# Patient Record
Sex: Male | Born: 1955
Health system: Southern US, Community
[De-identification: ages and names within clinical notes are randomized; demographics above are authoritative.]

## PROBLEM LIST (undated history)

## (undated) DIAGNOSIS — C61 Malignant neoplasm of prostate: Secondary | ICD-10-CM

## (undated) DIAGNOSIS — R972 Elevated prostate specific antigen [PSA]: Secondary | ICD-10-CM

## (undated) DIAGNOSIS — Z808 Family history of malignant neoplasm of other organs or systems: Secondary | ICD-10-CM

## (undated) DIAGNOSIS — K219 Gastro-esophageal reflux disease without esophagitis: Secondary | ICD-10-CM

## (undated) DIAGNOSIS — Z803 Family history of malignant neoplasm of breast: Secondary | ICD-10-CM

## (undated) DIAGNOSIS — Z8042 Family history of malignant neoplasm of prostate: Secondary | ICD-10-CM

## (undated) DIAGNOSIS — H409 Unspecified glaucoma: Secondary | ICD-10-CM

## (undated) HISTORY — DX: Malignant neoplasm of prostate: C61

## (undated) HISTORY — DX: Family history of malignant neoplasm of other organs or systems: Z80.8

## (undated) HISTORY — DX: Unspecified glaucoma: H40.9

## (undated) HISTORY — DX: Family history of malignant neoplasm of breast: Z80.3

## (undated) HISTORY — PX: HERNIA REPAIR: SHX51

## (undated) HISTORY — DX: Gastro-esophageal reflux disease without esophagitis: K21.9

## (undated) HISTORY — DX: Family history of malignant neoplasm of prostate: Z80.42

---

## 2013-12-30 HISTORY — PX: PROSTATECTOMY: SHX69

## 2014-08-04 DIAGNOSIS — L570 Actinic keratosis: Secondary | ICD-10-CM | POA: Insufficient documentation

## 2014-08-04 DIAGNOSIS — H409 Unspecified glaucoma: Secondary | ICD-10-CM | POA: Insufficient documentation

## 2014-08-04 DIAGNOSIS — R972 Elevated prostate specific antigen [PSA]: Secondary | ICD-10-CM | POA: Insufficient documentation

## 2014-08-04 DIAGNOSIS — R0789 Other chest pain: Secondary | ICD-10-CM | POA: Insufficient documentation

## 2014-08-04 DIAGNOSIS — K219 Gastro-esophageal reflux disease without esophagitis: Secondary | ICD-10-CM | POA: Insufficient documentation

## 2014-10-20 DIAGNOSIS — N402 Nodular prostate without lower urinary tract symptoms: Secondary | ICD-10-CM | POA: Insufficient documentation

## 2019-02-25 DIAGNOSIS — C61 Malignant neoplasm of prostate: Secondary | ICD-10-CM | POA: Diagnosis not present

## 2019-03-04 DIAGNOSIS — C61 Malignant neoplasm of prostate: Secondary | ICD-10-CM | POA: Diagnosis not present

## 2019-06-08 DIAGNOSIS — H401313 Pigmentary glaucoma, right eye, severe stage: Secondary | ICD-10-CM | POA: Diagnosis not present

## 2019-06-08 DIAGNOSIS — H2513 Age-related nuclear cataract, bilateral: Secondary | ICD-10-CM | POA: Diagnosis not present

## 2019-06-08 DIAGNOSIS — H401321 Pigmentary glaucoma, left eye, mild stage: Secondary | ICD-10-CM | POA: Diagnosis not present

## 2019-06-08 DIAGNOSIS — H43813 Vitreous degeneration, bilateral: Secondary | ICD-10-CM | POA: Diagnosis not present

## 2019-11-03 DIAGNOSIS — Z1211 Encounter for screening for malignant neoplasm of colon: Secondary | ICD-10-CM | POA: Diagnosis not present

## 2019-11-03 DIAGNOSIS — Z0000 Encounter for general adult medical examination without abnormal findings: Secondary | ICD-10-CM | POA: Diagnosis not present

## 2019-12-01 DIAGNOSIS — Z1211 Encounter for screening for malignant neoplasm of colon: Secondary | ICD-10-CM | POA: Diagnosis not present

## 2019-12-01 DIAGNOSIS — Z01818 Encounter for other preprocedural examination: Secondary | ICD-10-CM | POA: Diagnosis not present

## 2019-12-07 DIAGNOSIS — H401313 Pigmentary glaucoma, right eye, severe stage: Secondary | ICD-10-CM | POA: Diagnosis not present

## 2019-12-07 DIAGNOSIS — H401321 Pigmentary glaucoma, left eye, mild stage: Secondary | ICD-10-CM | POA: Diagnosis not present

## 2019-12-17 DIAGNOSIS — Z1159 Encounter for screening for other viral diseases: Secondary | ICD-10-CM | POA: Diagnosis not present

## 2019-12-22 DIAGNOSIS — Z1211 Encounter for screening for malignant neoplasm of colon: Secondary | ICD-10-CM | POA: Diagnosis not present

## 2019-12-22 DIAGNOSIS — K5730 Diverticulosis of large intestine without perforation or abscess without bleeding: Secondary | ICD-10-CM | POA: Diagnosis not present

## 2019-12-22 DIAGNOSIS — D122 Benign neoplasm of ascending colon: Secondary | ICD-10-CM | POA: Diagnosis not present

## 2019-12-22 DIAGNOSIS — K640 First degree hemorrhoids: Secondary | ICD-10-CM | POA: Diagnosis not present

## 2020-05-01 DIAGNOSIS — C61 Malignant neoplasm of prostate: Secondary | ICD-10-CM | POA: Diagnosis not present

## 2020-05-05 DIAGNOSIS — C61 Malignant neoplasm of prostate: Secondary | ICD-10-CM | POA: Diagnosis not present

## 2020-05-05 DIAGNOSIS — N400 Enlarged prostate without lower urinary tract symptoms: Secondary | ICD-10-CM | POA: Diagnosis not present

## 2020-05-19 DIAGNOSIS — N400 Enlarged prostate without lower urinary tract symptoms: Secondary | ICD-10-CM | POA: Diagnosis not present

## 2020-07-12 DIAGNOSIS — H401321 Pigmentary glaucoma, left eye, mild stage: Secondary | ICD-10-CM | POA: Diagnosis not present

## 2020-07-12 DIAGNOSIS — H524 Presbyopia: Secondary | ICD-10-CM | POA: Diagnosis not present

## 2020-07-12 DIAGNOSIS — H2513 Age-related nuclear cataract, bilateral: Secondary | ICD-10-CM | POA: Diagnosis not present

## 2020-07-12 DIAGNOSIS — H401313 Pigmentary glaucoma, right eye, severe stage: Secondary | ICD-10-CM | POA: Diagnosis not present

## 2020-11-21 ENCOUNTER — Ambulatory Visit: Payer: BC Managed Care – PPO | Admitting: Sports Medicine

## 2020-11-21 ENCOUNTER — Ambulatory Visit
Admission: RE | Admit: 2020-11-21 | Discharge: 2020-11-21 | Disposition: A | Discharge status: Home / Self Care | Payer: BC Managed Care – PPO | Source: Ambulatory Visit | Attending: Sports Medicine | Admitting: Sports Medicine

## 2020-11-21 VITALS — BP 129/84 | Ht 70.0 in | Wt 161.0 lb

## 2020-11-21 DIAGNOSIS — M25561 Pain in right knee: Secondary | ICD-10-CM | POA: Diagnosis not present

## 2020-11-21 NOTE — Progress Notes (Signed)
PCP: No primary care provider on file.  Subjective:   HPI: Patient is a 64 y.o. male runner here for evaluation of right knee pain.  Patient states that he started having knee pain and the sensation of his knee giving out on him starting in July 2021.  He reports that a few days prior to the first time his knee started giving out, he was at a wedding and was doing some dancing and jumping though he does not recall any specific injury.  He denies any point have any knee swelling.  He tried to manage conservatively by reducing his mileage and trying to do some quad and hamstring strengthening on his own.  He did not notice much benefit from this.  His symptoms progressed from giving out to more significant pain in his knee.  Pain feels deep in his knee joint and he cannot locate side.  It is worse with deep flexion like when he is doing deep squats or stretching his quads.  He has not noticed any numbness or tingling, he has some occasional right-sided back pain but nothing significant.  He did not notice significant weakness aside from the times when he gives out. Thus far, he has tried new shoes, rest, home exercise program, and avoiding aggravating activities but it has continued to be a problem for him.   Review of Systems:  Per HPI.   [place], medications and smoking status reviewed.      Objective:  Physical Exam:  [place] Adult Exercise 11/21/2020  Frequency of aerobic exercise (# of days/week) 5  Average time in minutes 60  Frequency of strengthening activities (# of days/week) 3     Gen: awake, alert, NAD, comfortable in exam room Pulm: breathing unlabored  Right knee  Inspection: Bilateral knees without evidence of erythema, ecchymosis, swelling, edema. No effusion present.  There is a 1 cm leg length discrepancy (left leg longer than right leg). ROM: Intact. 0-160d, with pain at end flexion both passively and actively.  Strength: 5/5 strength to resisted  flexion/extension without pain  Patella: Negative patellar grind. No patellar facet tenderness. No apprehension. No proximal or distal patellar tendon tenderness to palpation. No quad tendon tenderness to palpation.  Tibia: No tibial plateau, tibial tuberosity tenderness.  Joint line: Positive medial/posterior medial joint line tenderness Popliteal: No popliteal tenderness to the insertional gastroc. No insertional biceps femoris, semimembranosis, semitendinosis tenderness.  McMurrays/Thessaly's: Negative bilaterally for pain, catching  Lachmans: Stable bilaterally with firm endpoint  Anterior/Posterior drawer: Stable bilaterally Varus/valgus stress at 0, 15d: Negative for pain, laxity  Gait: With standing, patient has collapse of the longitudinal arch bilaterally, more significant on the left with slight eversion of the left foot.  While running, patient has significant overpronation bilaterally, more significant on the left with genu valgus of the left knee.   Assessment & Plan:  1.  Right knee pain  Differential for patient's knee pain includes osteoarthritis, meniscus tear.  His medial pain on palpation, along with the giving out and pain with deep flexion is suspicious for medial meniscus degenerative tear.  His gait does show significant overpronation and he has a slight leg length discrepancy.  Suspect this may be contributing to symptoms.  We discussed consideration of orthotics and he would like to pursue this.  Given that he is not having mechanical symptoms we will hold off on MRI.  Plan: -X-ray right knee to investigate for OA -Home exercise program: Hip abductor strengthening and isometric quad exercises -  Body helix knee compression sleeve -Return next Tuesday for orthotics -OTC Tylenol/ibuprofen as needed  Dagoberto Ligas, MD [facility] Fellow 11/21/2020 10:13 AM   Patient seen and evaluated with the sports medicine fellow.  I agree with the above plan of care.   Patient will get an x-ray of his right knee and will follow up with [place] in 1 week for custom orthotics.  We will also fit his right knee with a body helix compression sleeve and he will start a home exercise program concentrating on hip abductor strengthening and quad strengthening.

## 2020-11-21 NOTE — Patient Instructions (Signed)
Thank you for coming in to see [place] today!  Your right knee pain is likely due to a degenerative meniscus tear or osteoarthritis.  Please see below to review our plan for today's visit:   1.   Please start doing the physical therapy exercises that were shown to you today. 2.   Please go to [facility] imaging for x-rays of your right knee. 3.   Try using a compression sleeve when you are doing aggravating activities. 4.  Please follow-up in 1 week for orthotics, please bring your shoes. 5.  You can use Tylenol or ibuprofen as needed for pain.   Please call the clinic at [phone] if your symptoms worsen or you have any concerns. It was our pleasure to serve you.       Dr. Dagoberto Ligas [facility]

## 2020-11-28 ENCOUNTER — Ambulatory Visit (INDEPENDENT_AMBULATORY_CARE_PROVIDER_SITE_OTHER): Payer: BC Managed Care – PPO | Admitting: Sports Medicine

## 2020-11-28 ENCOUNTER — Other Ambulatory Visit: Payer: Self-pay

## 2020-11-28 VITALS — BP 118/84 | Ht 70.0 in | Wt 162.0 lb

## 2020-11-28 DIAGNOSIS — M2141 Flat foot [pes planus] (acquired), right foot: Secondary | ICD-10-CM

## 2020-11-28 DIAGNOSIS — M25561 Pain in right knee: Secondary | ICD-10-CM | POA: Diagnosis not present

## 2020-11-28 DIAGNOSIS — M2142 Flat foot [pes planus] (acquired), left foot: Secondary | ICD-10-CM

## 2020-11-28 NOTE — Progress Notes (Signed)
Tim presents for fitting of a custom orthotic.  He was just recently seen here with right-sided knee pain, x-rays were fairly unremarkable and we suspect degenerative meniscus tear as the cause of his pain.  He has been doing his exercises as recommended and feels like he started have some benefit with this.  Patient was fitted for a : standard, cushioned, semi-rigid orthotic. The orthotic was heated and afterward the patient stood on the orthotic blank positioned on the orthotic stand. The patient was positioned in subtalar neutral position and 10 degrees of ankle dorsiflexion in a weight bearing stance. After completion of molding, a stable base was applied to the orthotic blank. The blank was ground to a stable position for weight bearing. Size: 10 Base: Blue EVA Posting: None Additional orthotic padding: Patient does have a small approximately 1 cm leg length discrepancy (left longer than right), so a higher amount of blue EVA base on the left side was shaved off during the fashioning process.  Patient reported that the orthotics felt comfortable with testing in the hallway.  He was also noted to have significant improvement in his overpronation to near neutral.  We will plan to follow-up in approximately 1 month to check back in.  Continue current interventions for now.  Dagoberto Ligas, MD Sports Medicine Fellow, [place]  Patient seen and evaluated with the sports medicine fellow.  I agree with the above plan of care.  X-rays show a paucity of degenerative changes.  Custom orthotics were created as above.  He will continue with his home exercises and a gradual return to running.  Follow-up with me again in 4 weeks.  If symptoms persist, we may need to consider merits of further diagnostic imaging.

## 2021-01-04 ENCOUNTER — Ambulatory Visit: Payer: BC Managed Care – PPO | Admitting: Sports Medicine

## 2021-01-04 ENCOUNTER — Other Ambulatory Visit: Payer: Self-pay

## 2021-01-04 VITALS — BP 112/78 | Ht 70.0 in | Wt 161.0 lb

## 2021-01-04 DIAGNOSIS — M25561 Pain in right knee: Secondary | ICD-10-CM

## 2021-01-04 NOTE — Progress Notes (Signed)
Patient ID: Stedman [place], male   DOB: [birth year], 65 y.o.   MRN: [id number]  Rosanne Ashing comes in today for follow-up on right knee pain.  His pain is improving.  He has been able to return to running and is happy with his progress.  He continues to wear his compression sleeve.  He has been diligent about doing home exercises to strengthen his quads and hips.  He finds his orthotics to be comfortable as well.  The feelings of instability that he was experiencing previously have not completely resolved but are a lot less frequent.  Physical exam was not repeated today.  I am pleased with his progress to date.  I did provide him with the contact information for [facility] if he would like more advanced exercises for his quad, hamstrings, and hip.  He understands the 10% role when it comes to running injuries and return to running.  At this point in time I do not feel like any further work-up or treatment is necessary.  X-rays of his knee done previously showed no significant degenerative changes.  If his symptoms return then we may need to consider merits of further diagnostic imaging.  Follow-up for ongoing or recalcitrant issues.

## 2021-01-05 ENCOUNTER — Encounter: Payer: Self-pay | Admitting: Internal Medicine

## 2021-01-05 ENCOUNTER — Other Ambulatory Visit: Payer: Self-pay | Admitting: Internal Medicine

## 2021-01-05 DIAGNOSIS — M546 Pain in thoracic spine: Secondary | ICD-10-CM | POA: Diagnosis not present

## 2021-01-05 DIAGNOSIS — M25561 Pain in right knee: Secondary | ICD-10-CM | POA: Diagnosis not present

## 2021-01-08 DIAGNOSIS — C61 Malignant neoplasm of prostate: Secondary | ICD-10-CM | POA: Diagnosis not present

## 2021-01-08 DIAGNOSIS — N400 Enlarged prostate without lower urinary tract symptoms: Secondary | ICD-10-CM | POA: Diagnosis not present

## 2021-01-22 DIAGNOSIS — R972 Elevated prostate specific antigen [PSA]: Secondary | ICD-10-CM | POA: Diagnosis not present

## 2021-01-22 DIAGNOSIS — C61 Malignant neoplasm of prostate: Secondary | ICD-10-CM | POA: Diagnosis not present

## 2021-02-02 ENCOUNTER — Other Ambulatory Visit: Payer: Self-pay

## 2021-02-02 ENCOUNTER — Ambulatory Visit: Payer: BC Managed Care – PPO | Admitting: Family Medicine

## 2021-02-02 DIAGNOSIS — M545 Low back pain, unspecified: Secondary | ICD-10-CM | POA: Diagnosis not present

## 2021-02-02 MED ORDER — MELOXICAM 15 MG PO TABS: ORAL_TABLET | ORAL | 0 refills | Status: DC

## 2021-02-02 MED ORDER — CYCLOBENZAPRINE HCL 5 MG PO TABS: ORAL_TABLET | ORAL | 0 refills | Status: DC

## 2021-02-02 NOTE — Progress Notes (Addendum)
    SUBJECTIVE:   CHIEF COMPLAINT / HPI:   Acute low back pain Mr. Ian White is a very pleasant 65 year old male who is in a known patient at this practice presenting today with 6 days of acute low back pain.  It began while he was using a shovel to break up some ice around his house and as he was going down he went pick up a piece of it with a shovel and as he bent over and went to lift up he felt a pop in his low back.  He had some pain at that time almost immediately however it did not feel as bad he felt like he can keep going so he continued to work.  Slowly during the day it got worse until the point where he came that he needed to lay down.  He noticed after he lay back down and try to get up he was very stiff and it was difficult for him to move and he felt as if he was leaning and favoring one side.  He denies any instability or falls or changes to his balance.  He is having continual pain in his low back on both sides that is radiating around the lower back musculature.  He has no radiating symptoms down either leg.  He denies any numbness tingling or burning sensations.  He denies any saddle anesthesia he denies any bowel or bladder incontinence.  PERTINENT  PMH / PSH: GERD, elevated PSA, glaucoma  OBJECTIVE:   BP 124/82   Ht 5\' 10"  (1.778 m)   Wt 160 lb (72.6 kg)   BMI 22.96 kg/m   [facility] 11/21/2020 11/28/2020  Frequency of aerobic exercise (# of days/week) 5 5  Average time in minutes 60 60  Frequency of strengthening activities (# of days/week) 3 3    Lumbar spine:  - Inspection: no gross deformity or asymmetry, mild swelling at the right lower lumbar musculature, no ecchymosis. No skin changes - Palpation: No TTP over the spinous processes, TTP at paraspinal muscles bilaterally, no SI joint pain b/l - ROM: Very limited active ROM of the lumbar spine in flexion and extension, more pain is elicited with flexion and extension - Strength: 5/5  strength of lower extremity in L4-S1 nerve root distributions b/l - Neuro: sensation intact in the L4-S1 nerve root distribution b/l, 2+ L4 and S1 reflexes Special Tests:  - Slump test: NEG   ASSESSMENT/PLAN:   Low back pain Given history, presentation, and physical exam he most likely has muscle strain and spasm of the low back.  Most likely due to overexertion.  Recommend conservative treatment at this time and prescribed meloxicam and a muscle relaxer.  He had used salonpas patches with some relief and I recommended he could continue doing that as well as applying heating pad several times per day.  Also gave him some light stretches to do once he begins to feel some relief.  Follow-up in 4 weeks to ensure he is improving.  He will contact me if he does not improve with conservative management we can consider imaging.     Nuala Alpha, DO PGY-4, Sports Medicine Fellow [place]  I was the preceptor for this visit and available for immediate consultation Shellia Cleverly, DO

## 2021-02-02 NOTE — Assessment & Plan Note (Signed)
Given history, presentation, and physical exam he most likely has muscle strain and spasm of the low back.  Most likely due to overexertion.  Recommend conservative treatment at this time and prescribed meloxicam and a muscle relaxer.  He had used salonpas patches with some relief and I recommended he could continue doing that as well as applying heating pad several times per day.  Also gave him some light stretches to do once he begins to feel some relief.  Follow-up in 4 weeks to ensure he is improving.  He will contact me if he does not improve with conservative management we can consider imaging.

## 2021-02-02 NOTE — Patient Instructions (Signed)
It was great to meet you today! Thank you for letting me participate in your care!  Today, we discussed your low back pain which is from an acute muscle strain of the lumbar spinal muscles. I prescribed Meloxicam and Flexeril so please take them as directed. In terms of other things to do try for pain relief use a heating pad, salonpas patches, Blue Emu, or Voltaren Gel. You can also try capsaicin cream. Do the stretches at least once a day as long as they don't cause extreme pain.  If you have no improvement in 2 weeks please return to see me.  Be well, Harolyn Rutherford, DO PGY-4, Sports Medicine Fellow [place]

## 2021-03-05 DIAGNOSIS — Z0000 Encounter for general adult medical examination without abnormal findings: Secondary | ICD-10-CM | POA: Diagnosis not present

## 2021-03-05 DIAGNOSIS — Z125 Encounter for screening for malignant neoplasm of prostate: Secondary | ICD-10-CM | POA: Diagnosis not present

## 2021-03-12 DIAGNOSIS — Z1331 Encounter for screening for depression: Secondary | ICD-10-CM | POA: Diagnosis not present

## 2021-03-12 DIAGNOSIS — M546 Pain in thoracic spine: Secondary | ICD-10-CM | POA: Diagnosis not present

## 2021-03-12 DIAGNOSIS — Z0000 Encounter for general adult medical examination without abnormal findings: Secondary | ICD-10-CM | POA: Diagnosis not present

## 2021-03-12 DIAGNOSIS — Z1339 Encounter for screening examination for other mental health and behavioral disorders: Secondary | ICD-10-CM | POA: Diagnosis not present

## 2021-03-12 DIAGNOSIS — M9903 Segmental and somatic dysfunction of lumbar region: Secondary | ICD-10-CM | POA: Diagnosis not present

## 2021-03-13 DIAGNOSIS — Z1212 Encounter for screening for malignant neoplasm of rectum: Secondary | ICD-10-CM | POA: Diagnosis not present

## 2021-03-23 ENCOUNTER — Other Ambulatory Visit: Payer: Self-pay

## 2021-03-23 ENCOUNTER — Ambulatory Visit: Payer: BC Managed Care – PPO | Admitting: Family Medicine

## 2021-03-23 VITALS — BP 118/80 | Ht 69.25 in | Wt 158.0 lb

## 2021-03-23 DIAGNOSIS — S29011A Strain of muscle and tendon of front wall of thorax, initial encounter: Secondary | ICD-10-CM | POA: Diagnosis not present

## 2021-03-23 NOTE — Assessment & Plan Note (Signed)
Left.  Do not think he has a rupture as he has normal strength and full range of motion and there are no obvious ecchymoses.  He obviously has a small tear as he has tenderness and swelling.  We discussed rehab of this area and I gave him some exercise handouts.  Continue activities that do not bother him.  He is very active in his goal is to remain physically fit and to avoid injury if possible which I think is admirable.  Follow-up as needed.

## 2021-03-23 NOTE — Progress Notes (Signed)
  Ian White - 65 y.o. male MRN [id number]  Date of birth: Oct 23, 1956    SUBJECTIVE:      Chief Complaint:/ HPI:  Left-sided chest pain that started last weekend.  He was playing soccer with his children and grandchildren.  Had 1 incident where he hyper abducted his left arm and one incident when he fell onto his chest.  Had no specific pain in his chest with either event but that night he started to have some pain.  It progressed over the next few days until Wednesday he had significant pain in the left chest.  Interestingly, he was able to continue his regular running routine of 5 to 7 miles every other day without any shortness of breath or increase in pain.  Today his pain is resolving.  He would like to avoid having similar injury in the future.  He would also like to make sure that he is on mark with his diagnosis of muscle strain.    OBJECTIVE: BP 118/80   Ht 5' 9.25" (1.759 m)   Wt 158 lb (71.7 kg)   BMI 23.16 kg/m   Physical Exam:  Vital signs are reviewed. GENERAL: Well-developed male no acute distress CHEST: Symmetrical.  He is tender to palpation over the insertion of the left pectoralis minor muscle and reproduction of pain with palpation at this point. Shoulders: Full range of motion all planes of the rotator cuff.  The liftoff test is normal bilaterally. SKIN: No ecchymoses in the left chest area.  There is a small swollen tender nodule at the origin of the pectoralis on the 3-5th rib area. RESPIRATORY: No increased work of breathing.  ASSESSMENT & PLAN:  See problem based charting & AVS for pt instructions. Pectoralis muscle strain Left.  Do not think he has a rupture as he has normal strength and full range of motion and there are no obvious ecchymoses.  He obviously has a small tear as he has tenderness and swelling.  We discussed rehab of this area and I gave him some exercise handouts.  Continue activities that do not bother him.  He is very active in his goal is  to remain physically fit and to avoid injury if possible which I think is admirable.  Follow-up as needed.

## 2021-03-28 DIAGNOSIS — H524 Presbyopia: Secondary | ICD-10-CM | POA: Diagnosis not present

## 2021-03-28 DIAGNOSIS — H2513 Age-related nuclear cataract, bilateral: Secondary | ICD-10-CM | POA: Diagnosis not present

## 2021-03-28 DIAGNOSIS — H401313 Pigmentary glaucoma, right eye, severe stage: Secondary | ICD-10-CM | POA: Diagnosis not present

## 2021-03-28 DIAGNOSIS — H43813 Vitreous degeneration, bilateral: Secondary | ICD-10-CM | POA: Diagnosis not present

## 2021-04-03 ENCOUNTER — Ambulatory Visit: Payer: BC Managed Care – PPO | Admitting: Sports Medicine

## 2021-04-03 ENCOUNTER — Other Ambulatory Visit: Payer: Self-pay

## 2021-04-03 VITALS — BP 122/70 | Ht 69.75 in | Wt 158.0 lb

## 2021-04-03 DIAGNOSIS — M545 Low back pain, unspecified: Secondary | ICD-10-CM

## 2021-04-03 DIAGNOSIS — M25561 Pain in right knee: Secondary | ICD-10-CM | POA: Diagnosis not present

## 2021-04-03 DIAGNOSIS — S29011D Strain of muscle and tendon of front wall of thorax, subsequent encounter: Secondary | ICD-10-CM | POA: Diagnosis not present

## 2021-04-03 NOTE — Progress Notes (Addendum)
PCP: Sueanne Margarita, DO  Subjective:   HPI: Patient is a 65 y.o. male here for follow-up for the following issues:  Right knee pain He was last seen 3 months ago for this issue.  At that time, he was given a number of hip strengthening exercises to help with his knee pain in addition to shoe inserts.  He reports that he has been performing these exercises regularly and has found them very helpful.  He has not reached out for formal physical therapy.  He is now able to run up to 30 miles a week without significant pain.  He does note that if he twists to the right he will get a bit of pain in his hip but that seems to be the only way that he notices any trouble at all.  He is very pleased with his recovery so far.  Low back pain He was last seen for this 2 months ago at which time he was given core strengthening exercises and back strengthening exercises.  He has also been performing these exercises regularly and found them very helpful.  He does still notice some low back pain that limits his running but this seems to only be a minor inconvenience.  For the most part, he has noticed significant improvement with his back pain.  Pectoral muscle strain Last seen about 2 weeks ago for this issue.  He reports a chest muscle strain resulting from picking up out of his grandchildren.  He was given physical therapy exercises for this muscle strain but has not been able to do them yet.  So far at home, he is now able to do common household activities including lifting a gallon of milk and mowing the lawn with a push mower.  He has not yet had the strength to start physical therapy with band exercises but believes he is getting to that point.  He notices this discomfort most when sneezing.   Review of Systems:  Per HPI.   [place], medications and smoking status reviewed.      Objective:  Physical Exam:  [place] Adult Exercise 11/21/2020 11/28/2020  Frequency of aerobic exercise (# of  days/week) 5 5  Average time in minutes 60 60  Frequency of strengthening activities (# of days/week) 3 3     Gen: awake, alert, NAD, comfortable in exam room Pulm: breathing unlabored  Knee: - Inspection: no gross deformity. No swelling/effusion, erythema or bruising. Skin intact - Palpation: no TTP - ROM: full active ROM with flexion and extension in knee and hip - Strength: 5/5 strength - Neuro/vasc: NV intact  Lumbar spine: - Inspection: no gross deformity or asymmetry, swelling or ecchymosis - Palpation: No TTP over the spinous processes, paraspinal muscles, or SI joints b/l  Chest: -Inspection: No gross deformity.  Mild swelling noted over his right pectoral at about the level of the second rib.  No evidence of bruising. -Palpation: No tenderness to palpation over the origin or insertion of his pectoral muscle.  Pec tendon insertion palpable at the proximal humerus. -Range of motion with minimal discomfort with extension of the shoulder     Assessment & Plan:  1.  Right knee pain Significantly improved at this point.  He is able to run 30 miles a week without significant issue.  Seems that he still has minor hip pain but overall is improving nicely.  He was encouraged to continue with these exercises and continue with his running regimen as tolerated.  2.  Back pain This is also improved nicely with physical therapy exercises.  He does still seem slightly limited in his running from his back pain.  He was encouraged to continue with home physical therapy exercises and run as tolerated.  3.  Pectoral strain Based on his history, it seems that he did have a significant strain as his pectoralis muscle.  Mild swelling noted on physical exam today.  He was encouraged to refrain from any significant band exercises at this time and to simply go about his normal activity as tolerated.  He was encouraged not to work through pain for this pectoral strain as it may take significant time  to heal and pain would set him back.  No further work-up or imaging needed at this time.  Matilde Haymaker, MD PGY-3 04/03/2021 2:07 PM  Patient seen and evaluated with the resident.  I agree with the above plan of care.  Right knee pain and low back pain have improved with treatment.  Pectoralis major strain is improving albeit slowly.  I recommended that he discontinue his home exercises for his pectoralis muscle and simply resume activity as tolerated.  If pain does not continue to improve then he will follow-up with me and I will consider imaging including chest x-ray and ultrasound.  Follow-up for ongoing or recalcitrant issues.

## 2021-07-09 DIAGNOSIS — C61 Malignant neoplasm of prostate: Secondary | ICD-10-CM | POA: Diagnosis not present

## 2021-07-10 ENCOUNTER — Other Ambulatory Visit (HOSPITAL_COMMUNITY): Payer: Self-pay | Admitting: Urology

## 2021-07-10 DIAGNOSIS — C61 Malignant neoplasm of prostate: Secondary | ICD-10-CM

## 2021-07-10 DIAGNOSIS — R9721 Rising PSA following treatment for malignant neoplasm of prostate: Secondary | ICD-10-CM

## 2021-08-06 ENCOUNTER — Ambulatory Visit (HOSPITAL_COMMUNITY)
Admission: RE | Admit: 2021-08-06 | Discharge: 2021-08-06 | Disposition: A | Discharge status: Home / Self Care | Payer: BC Managed Care – PPO | Source: Ambulatory Visit | Attending: Urology | Admitting: Urology

## 2021-08-06 ENCOUNTER — Other Ambulatory Visit: Payer: Self-pay

## 2021-08-06 DIAGNOSIS — C61 Malignant neoplasm of prostate: Secondary | ICD-10-CM | POA: Insufficient documentation

## 2021-08-06 DIAGNOSIS — R9721 Rising PSA following treatment for malignant neoplasm of prostate: Secondary | ICD-10-CM | POA: Insufficient documentation

## 2021-08-06 MED ORDER — PIFLIFOLASTAT F 18 (PYLARIFY) INJECTION
9.0000 | Freq: Once | INTRAVENOUS | Status: AC
  Administered 2021-08-06: 9 via INTRAVENOUS

## 2021-08-13 DIAGNOSIS — N400 Enlarged prostate without lower urinary tract symptoms: Secondary | ICD-10-CM | POA: Diagnosis not present

## 2021-08-13 DIAGNOSIS — C61 Malignant neoplasm of prostate: Secondary | ICD-10-CM | POA: Diagnosis not present

## 2021-09-25 DIAGNOSIS — H401313 Pigmentary glaucoma, right eye, severe stage: Secondary | ICD-10-CM | POA: Diagnosis not present

## 2021-09-25 DIAGNOSIS — H04122 Dry eye syndrome of left lacrimal gland: Secondary | ICD-10-CM | POA: Diagnosis not present

## 2021-10-02 DIAGNOSIS — Z1339 Encounter for screening examination for other mental health and behavioral disorders: Secondary | ICD-10-CM | POA: Diagnosis not present

## 2021-10-02 DIAGNOSIS — Z7189 Other specified counseling: Secondary | ICD-10-CM | POA: Diagnosis not present

## 2021-10-02 DIAGNOSIS — G8929 Other chronic pain: Secondary | ICD-10-CM | POA: Diagnosis not present

## 2021-10-02 DIAGNOSIS — Z1331 Encounter for screening for depression: Secondary | ICD-10-CM | POA: Diagnosis not present

## 2021-12-12 DIAGNOSIS — C61 Malignant neoplasm of prostate: Secondary | ICD-10-CM | POA: Diagnosis not present

## 2021-12-19 DIAGNOSIS — C61 Malignant neoplasm of prostate: Secondary | ICD-10-CM | POA: Diagnosis not present

## 2021-12-19 DIAGNOSIS — N393 Stress incontinence (female) (male): Secondary | ICD-10-CM | POA: Diagnosis not present

## 2022-01-24 ENCOUNTER — Other Ambulatory Visit: Payer: Self-pay | Admitting: Urology

## 2022-01-24 DIAGNOSIS — R9721 Rising PSA following treatment for malignant neoplasm of prostate: Secondary | ICD-10-CM

## 2022-01-24 DIAGNOSIS — C61 Malignant neoplasm of prostate: Secondary | ICD-10-CM

## 2022-01-29 ENCOUNTER — Ambulatory Visit
Admission: RE | Admit: 2022-01-29 | Discharge: 2022-01-29 | Disposition: A | Discharge status: Home / Self Care | Payer: BC Managed Care – PPO | Source: Ambulatory Visit | Attending: Sports Medicine | Admitting: Sports Medicine

## 2022-01-29 ENCOUNTER — Ambulatory Visit: Payer: BC Managed Care – PPO | Admitting: Sports Medicine

## 2022-01-29 ENCOUNTER — Other Ambulatory Visit: Payer: Self-pay

## 2022-01-29 ENCOUNTER — Encounter: Payer: Self-pay | Admitting: Sports Medicine

## 2022-01-29 VITALS — BP 130/88 | Ht 70.0 in | Wt 160.0 lb

## 2022-01-29 DIAGNOSIS — M25532 Pain in left wrist: Secondary | ICD-10-CM

## 2022-01-29 DIAGNOSIS — M79642 Pain in left hand: Secondary | ICD-10-CM | POA: Diagnosis not present

## 2022-01-29 DIAGNOSIS — M19032 Primary osteoarthritis, left wrist: Secondary | ICD-10-CM | POA: Diagnosis not present

## 2022-01-29 NOTE — Patient Instructions (Signed)
Thank you for coming to see me today. It was a pleasure.   We will x-ray your left wrist today.  Dr. Micheline Chapman will call you with the results Can use Voltaren gel 3-4 times daily as needed Wrist brace especially at night.  Take off splint periodically for today to move your wrist.  Please follow-up with Dr. Micheline Chapman in 2 weeks  If you have any questions or concerns, please do not hesitate to call the office at [phone].  Best,   Carollee Leitz, MD

## 2022-01-29 NOTE — Progress Notes (Addendum)
° ° °  SUBJECTIVE:   CHIEF COMPLAINT / HPI: Left wrist pain  Symptoms started after fall 3 weeks ago.  Patient reports running 20 5K and tripped down a hill.  [place] forward and landed on outstretched hands.  Not feel any pain at the time of injury.  Is more concerned with right knee pain and pain on left rib area.  Was able to complete run.  Noticed after running was having wrist pain and swelling.  Tried Ibuprofen/Tylenol and ice for couple of days which did help.  Denies any numbness, tingling or weakness of hand, fingers or wrist.  Able to perform all daily activities including typing, opening cans.  PERTINENT  PMH / PSH:    OBJECTIVE:   BP 130/88    Ht 5\' 10"  (1.778 m)    Wt 160 lb (72.6 kg)    BMI 22.96 kg/m    General: Alert, no acute distress Wrist:  Inspection: No evidence of erythema, ecchymosis, swelling, edema.  ROM: Intact ROM to wrist flexion/extension, ulnar/radial deviation w pain  Strength: 5/5 strength to resisted wrist flexion/extension, ulnar/radial deviation  Palpation: No tenderness to palpation to distal radius/ulna, DRUJ, scaphoid, scapholunate joint, metacarpals, TFCC  Special tests: Neg axial load to scaphoid. Neg mid-carpal shift. Neg Watson's test   Hand: Inspection: No obvious deformity. No swelling, erythema or bruising Palpation: TTP over third and fifth proximal metacarpal. ROM: Full ROM of the digits and wrist Strength: 5/5 strength in the forearm, wrist and interosseus muscles Neurovascular: NV intact Special tests: Negative finkelstein's, negative tinel's at the carpal tunnel, negative Phalen's and reverse Phalen's  ASSESSMENT/PLAN:   Left hand pain Continues to have left hand pain after an outstretched hand injury 3 weeks ago.  Initially relieved with medication and ice.  Likely severe sprain now resolving but cannot rule out fracture. -X-ray left wrist -Tylenol/ibuprofen as needed -Wrist brace daily and at night.  Advised patient to remove brace  intermittently to need gentle motion exercises of wrist every couple of hours. -Follow-up with Dr. Micheline Chapman in 2 weeks     Carollee Leitz, MD [place]   Patient seen and evaluated with the resident.  I agree with the above plan of care.  X-ray of the left wrist to rule out fracture.  We will immobilize with a cock up wrist brace with a tentative follow-up with me again in 2 weeks.  Phone follow-up with x-ray results when available.  Addendum: X-rays of the left wrist show no obvious fracture.  Patient notified via telephone of results.  Follow-up with me in 2 weeks as scheduled.

## 2022-01-29 NOTE — Assessment & Plan Note (Signed)
Continues to have left hand pain after an outstretched hand injury 3 weeks ago.  Initially relieved with medication and ice.  Likely severe sprain now resolving but cannot rule out fracture. -X-ray left wrist -Tylenol/ibuprofen as needed -Wrist brace daily and at night.  Advised patient to remove brace intermittently to need gentle motion exercises of wrist every couple of hours. -Follow-up with Dr. Micheline Chapman in 2 weeks

## 2022-02-07 ENCOUNTER — Other Ambulatory Visit: Payer: Self-pay

## 2022-02-07 ENCOUNTER — Ambulatory Visit
Admission: RE | Admit: 2022-02-07 | Discharge: 2022-02-07 | Disposition: A | Discharge status: Home / Self Care | Payer: BC Managed Care – PPO | Source: Ambulatory Visit | Attending: Urology | Admitting: Urology

## 2022-02-07 DIAGNOSIS — C61 Malignant neoplasm of prostate: Secondary | ICD-10-CM

## 2022-02-07 DIAGNOSIS — R9721 Rising PSA following treatment for malignant neoplasm of prostate: Secondary | ICD-10-CM

## 2022-02-07 DIAGNOSIS — R972 Elevated prostate specific antigen [PSA]: Secondary | ICD-10-CM | POA: Diagnosis not present

## 2022-02-07 MED ORDER — GADOBENATE DIMEGLUMINE 529 MG/ML IV SOLN
15.0000 mL | Freq: Once | INTRAVENOUS | Status: AC | PRN
  Administered 2022-02-07: 15 mL via INTRAVENOUS

## 2022-02-12 ENCOUNTER — Ambulatory Visit: Payer: BC Managed Care – PPO | Admitting: Sports Medicine

## 2022-02-12 VITALS — BP 110/78 | Ht 69.5 in | Wt 160.0 lb

## 2022-02-12 DIAGNOSIS — M25532 Pain in left wrist: Secondary | ICD-10-CM | POA: Diagnosis not present

## 2022-02-13 NOTE — Progress Notes (Signed)
° °  Subjective:    Patient ID: Ian White, male    DOB: 15-Jul-1956, 66 y.o.   MRN: [id number]  HPI  [name] presents today for follow-up on his left wrist contusion.  He is doing much better.  X-rays showed no obvious fracture.  He has found immobilization in the wrist brace to be helpful.   Review of Systems As above    Objective:   Physical Exam  Well-developed, well-nourished.  No acute distress  Left wrist: Full range of motion.  No effusion.  No soft tissue swelling.  There is some mild tenderness across the volar aspect of the wrist, specifically near the pisiform and hook of the hamate but it is improved from his last visit.  Mild pain with liftoff testing but he is able to lift himself completely off the table.  Good pulses.      Assessment & Plan:   Improving left wrist pain secondary to contusion  X-rays are reassuring.  I think he can start to wean from his wrist brace as his symptoms allow.  He may continue to increase activity as tolerated but I did recommend that he avoid any sort of exercise that places weight on his left wrist such as push-ups or side planks until his wrist pain resolves completely.  Follow-up for ongoing or recalcitrant issues.  This note was dictated using Dragon naturally speaking software and may contain errors in syntax, spelling, or content which have not been identified prior to signing this note.

## 2022-03-13 DIAGNOSIS — C61 Malignant neoplasm of prostate: Secondary | ICD-10-CM | POA: Diagnosis not present

## 2022-03-20 DIAGNOSIS — R9721 Rising PSA following treatment for malignant neoplasm of prostate: Secondary | ICD-10-CM | POA: Diagnosis not present

## 2022-03-20 DIAGNOSIS — N393 Stress incontinence (female) (male): Secondary | ICD-10-CM | POA: Diagnosis not present

## 2022-03-20 DIAGNOSIS — C61 Malignant neoplasm of prostate: Secondary | ICD-10-CM | POA: Diagnosis not present

## 2022-04-03 DIAGNOSIS — C61 Malignant neoplasm of prostate: Secondary | ICD-10-CM | POA: Diagnosis not present

## 2022-04-10 DIAGNOSIS — C61 Malignant neoplasm of prostate: Secondary | ICD-10-CM | POA: Diagnosis not present

## 2022-04-24 DIAGNOSIS — H401313 Pigmentary glaucoma, right eye, severe stage: Secondary | ICD-10-CM | POA: Diagnosis not present

## 2022-04-24 DIAGNOSIS — H5213 Myopia, bilateral: Secondary | ICD-10-CM | POA: Diagnosis not present

## 2022-04-24 DIAGNOSIS — H2513 Age-related nuclear cataract, bilateral: Secondary | ICD-10-CM | POA: Diagnosis not present

## 2022-04-24 DIAGNOSIS — H35371 Puckering of macula, right eye: Secondary | ICD-10-CM | POA: Diagnosis not present

## 2022-05-07 DIAGNOSIS — Z0000 Encounter for general adult medical examination without abnormal findings: Secondary | ICD-10-CM | POA: Diagnosis not present

## 2022-05-07 DIAGNOSIS — Z125 Encounter for screening for malignant neoplasm of prostate: Secondary | ICD-10-CM | POA: Diagnosis not present

## 2022-05-07 DIAGNOSIS — R7989 Other specified abnormal findings of blood chemistry: Secondary | ICD-10-CM | POA: Diagnosis not present

## 2022-05-21 DIAGNOSIS — Z1389 Encounter for screening for other disorder: Secondary | ICD-10-CM | POA: Diagnosis not present

## 2022-05-21 DIAGNOSIS — Z0000 Encounter for general adult medical examination without abnormal findings: Secondary | ICD-10-CM | POA: Diagnosis not present

## 2022-05-21 DIAGNOSIS — K219 Gastro-esophageal reflux disease without esophagitis: Secondary | ICD-10-CM | POA: Diagnosis not present

## 2022-05-21 DIAGNOSIS — Z1331 Encounter for screening for depression: Secondary | ICD-10-CM | POA: Diagnosis not present

## 2022-05-22 ENCOUNTER — Other Ambulatory Visit: Payer: Self-pay | Admitting: Internal Medicine

## 2022-05-22 DIAGNOSIS — E785 Hyperlipidemia, unspecified: Secondary | ICD-10-CM

## 2022-05-29 ENCOUNTER — Ambulatory Visit
Admission: RE | Admit: 2022-05-29 | Discharge: 2022-05-29 | Disposition: A | Discharge status: Home / Self Care | Payer: No Typology Code available for payment source | Source: Ambulatory Visit | Attending: Internal Medicine | Admitting: Internal Medicine

## 2022-05-29 DIAGNOSIS — E7800 Pure hypercholesterolemia, unspecified: Secondary | ICD-10-CM | POA: Diagnosis not present

## 2022-05-29 DIAGNOSIS — E785 Hyperlipidemia, unspecified: Secondary | ICD-10-CM

## 2022-06-03 ENCOUNTER — Telehealth: Payer: Self-pay

## 2022-06-03 NOTE — Telephone Encounter (Signed)
NOTES SCANNED TO REFERRAL 

## 2022-06-04 ENCOUNTER — Ambulatory Visit (INDEPENDENT_AMBULATORY_CARE_PROVIDER_SITE_OTHER): Payer: BC Managed Care – PPO | Admitting: Sports Medicine

## 2022-06-04 ENCOUNTER — Ambulatory Visit: Payer: BC Managed Care – PPO | Admitting: Cardiovascular Disease

## 2022-06-04 VITALS — BP 110/80 | HR 46 | Ht 69.75 in | Wt 160.2 lb

## 2022-06-04 VITALS — BP 124/86 | Ht 70.0 in | Wt 156.0 lb

## 2022-06-04 DIAGNOSIS — M2141 Flat foot [pes planus] (acquired), right foot: Secondary | ICD-10-CM

## 2022-06-04 DIAGNOSIS — R1031 Right lower quadrant pain: Secondary | ICD-10-CM | POA: Diagnosis not present

## 2022-06-04 DIAGNOSIS — E782 Mixed hyperlipidemia: Secondary | ICD-10-CM

## 2022-06-04 DIAGNOSIS — M2142 Flat foot [pes planus] (acquired), left foot: Secondary | ICD-10-CM | POA: Diagnosis not present

## 2022-06-04 DIAGNOSIS — Q254 Congenital malformation of aorta unspecified: Secondary | ICD-10-CM | POA: Diagnosis not present

## 2022-06-04 DIAGNOSIS — M25579 Pain in unspecified ankle and joints of unspecified foot: Secondary | ICD-10-CM

## 2022-06-04 NOTE — Patient Instructions (Signed)
Medication Instructions:  Your physician recommends that you continue on your current medications as directed. Please refer to the Current Medication list given to you today.  *If you need a refill on your cardiac medications before your next appointment, please call your pharmacy*   Lab Work: LIPIDS, ALT in 3 months If you have labs (blood work) drawn today and your tests are completely normal, you will receive your results only by: [place] (if you have MyChart) OR A paper copy in the mail If you have any lab test that is abnormal or we need to change your treatment, we will call you to review the results.   Testing/Procedures: CTA of Aorta (in 1 year) Your physician has requested that you have cardiac CT. Cardiac computed tomography (CT) is a painless test that uses an x-ray machine to take clear, detailed pictures of your heart. For further information please visit HugeFiesta.tn. Please follow instruction sheet as given.  Follow-Up: At [facility], you and your health needs are our priority.  As part of our continuing mission to provide you with exceptional heart care, we have created designated Provider Care Teams.  These Care Teams include your primary Cardiologist (physician) and Advanced Practice Providers (APPs -  Physician Assistants and Nurse Practitioners) who all work together to provide you with the care you need, when you need it.    Your next appointment:   6 month(s)  The format for your next appointment:   In Person  Provider:   Mertie Moores, MD      Important Information About Sugar

## 2022-06-04 NOTE — Progress Notes (Signed)
Cardiology Office Note:    Date:  06/06/2022   ID:  Ian White, DOB 06-18-1956, MRN [id number]  PCP:  Sueanne Margarita, DO   [facility] Providers Cardiologist: Shanda Cadotte  }    Referring MD: Sueanne Margarita, DO   Chief Complaint  Patient presents with   coronary artery calcification   dilated aorta     History of Present Illness:   Seen with wife , Ian White is a 66 y.o. male with a hx of prostate cancer  Ian White had a screening coronary artery calcium score which revealed coronary artery calcium score of 650.  This places him in in the 88th percentile for age/gender matched controls.  He was incidentally noted to have mildly dilated ascending thoracic aorta 4.3 cm.  No CP or dyspnea - even with intense exercise . Hikes  Planning on doing [place] miles over 12 days .   Is runner , Ran a 25K race in Jan.  [place] on his chest and had CP following the fall.  Typically runs 5 miles a day, 4 days a week,  then occasionally runs a 10 mile   Father and mother had heart disease . Mother had a coronary stent,  MVP , MV repair - died of lymphoma Father had parkinsons, CAD late in his life   Mother parents died in their 44s   Aunt had CABG in her 59's  Works as an Optometrist for [employer].    Past Medical History:  Diagnosis Date   GERD (gastroesophageal reflux disease)    Glaucoma    bilateral   Prostate cancer [facility])     Past Surgical History:  Procedure Laterality Date   HERNIA REPAIR     PROSTATECTOMY  2015   due to cancer    Current Medications: Current Meds  Medication Sig   atorvastatin (LIPITOR) 40 MG tablet Take 40 mg by mouth daily.   LATANOPROST OP Apply to eye as directed.   sildenafil (REVATIO) 20 MG tablet Take by mouth as directed.   TIMOLOL HEMIHYDRATE OP Apply to eye as directed.     Allergies:   Grass pollen(k-o-r-t-swt vern)   Social History   Socioeconomic History   Marital status: Married    Spouse name:  Not on file   Number of children: Not on file   Years of education: Not on file   Highest education level: Not on file  Occupational History   Not on file  Tobacco Use   Smoking status: Never   Smokeless tobacco: Never  Substance and Sexual Activity   Alcohol use: Yes    Comment: monthly or less   Drug use: Not on file   Sexual activity: Not on file  Other Topics Concern   Not on file  Social History Narrative   Not on file   Social Determinants of Health   Financial Resource Strain: Not on file  Food Insecurity: Not on file  Transportation Needs: Not on file  Physical Activity: Not on file  Stress: Not on file  Social Connections: Not on file     Family History: The patient's family history includes Cancer in his father; Diabetes in his brother; Glaucoma in his father; Heart disease in his father and mother; Hypertension in his brother; Kidney disease in his brother; Lymphoma in his mother; Parkinson's disease in his father; Schizophrenia in his brother; Thyroid disease in his sister.  ROS:   Please see the history of present  illness.     All other systems reviewed and are negative.  EKGs/Labs/Other Studies Reviewed:    The following studies were reviewed today:    EKG:  June 04, 2022:   sinus brady at 46,  minimal voltage for LVH   Recent Labs: No results found for requested labs within last 365 days.  Recent Lipid Panel No results found for: "CHOL", "TRIG", "HDL", "CHOLHDL", "VLDL", "LDLCALC", "LDLDIRECT"   Risk Assessment/Calculations:         Physical Exam:    VS:  BP 110/80   Pulse (!) 46   Ht 5' 9.75" (1.772 m)   Wt 160 lb 3.2 oz (72.7 kg)   SpO2 97%   BMI 23.15 kg/m     Wt Readings from Last 3 Encounters:  06/04/22 160 lb 3.2 oz (72.7 kg)  06/04/22 156 lb (70.8 kg)  02/12/22 160 lb (72.6 kg)     GEN:  Well nourished, well developed in no acute distress HEENT: Normal NECK: No JVD; No carotid bruits LYMPHATICS: No lymphadenopathy CARDIAC:  RRR, no murmurs, rubs, gallops RESPIRATORY:  Clear to auscultation without rales, wheezing or rhonchi  ABDOMEN: Soft, non-tender, non-distended MUSCULOSKELETAL:  No edema; No deformity  SKIN: Warm and dry NEUROLOGIC:  Alert and oriented x 3 PSYCHIATRIC:  Normal affect   ASSESSMENT:    1. Abnormality of thoracic aorta   2. Mixed hyperlipidemia    PLAN:    In order of problems listed above:  Coronary artery calcification:  his risk CAC of 650 .  LDL goal is 50-70 .  He is very active - runs long distance running without any angina symptoms . No stress testing indicated at this pont  Fasting lipids and ALT in 3 months.  2.  Dilated ascending aorta :   4.3 cm  .   Will follow up with a CTA of the aorta     I will see him again in 6 months for follow-up visit       Medication Adjustments/Labs and Tests Ordered: Current medicines are reviewed at length with the patient today.  Concerns regarding medicines are outlined above.  Orders Placed This Encounter  Procedures   CT ANGIO CHEST AORTA W/CM & OR WO/CM   Lipid panel   ALT   EKG 12-Lead   No orders of the defined types were placed in this encounter.   Patient Instructions  Medication Instructions:  Your physician recommends that you continue on your current medications as directed. Please refer to the Current Medication list given to you today.  *If you need a refill on your cardiac medications before your next appointment, please call your pharmacy*   Lab Work: LIPIDS, ALT in 3 months If you have labs (blood work) drawn today and your tests are completely normal, you will receive your results only by: [place] (if you have MyChart) OR A paper copy in the mail If you have any lab test that is abnormal or we need to change your treatment, we will call you to review the results.   Testing/Procedures: CTA of Aorta (in 1 year) Your physician has requested that you have cardiac CT. Cardiac computed tomography  (CT) is a painless test that uses an x-ray machine to take clear, detailed pictures of your heart. For further information please visit HugeFiesta.tn. Please follow instruction sheet as given.  Follow-Up: At [facility], you and your health needs are our priority.  As part of our continuing mission to provide you with exceptional  heart care, we have created designated Provider Care Teams.  These Care Teams include your primary Cardiologist (physician) and Advanced Practice Providers (APPs -  Physician Assistants and Nurse Practitioners) who all work together to provide you with the care you need, when you need it.    Your next appointment:   6 month(s)  The format for your next appointment:   In Person  Provider:   Mertie Moores, MD      Important Information About Sugar         Signed, Mertie Moores, MD  06/06/2022 6:45 PM    [place]

## 2022-06-05 NOTE — Progress Notes (Signed)
   Subjective:    Patient ID: Ian White, male    DOB: 09/20/1956, 66 y.o.   MRN: [id number]  HPI chief complaint: Bilateral foot pain  Ian White presents today to discuss bilateral foot pain has been present for a few weeks.  He is an avid runner and endorses pain when running with his orthotics in his shoes.  Custom orthotics were made in 2021 and up until recently have been comfortable.  However, he notices generalized soreness along the plantar aspect of his foot when running in his orthotics.  The soreness resolves when he removes his orthotics and runs with the original tennis shoe inserts.  He is questioning whether or not he may need new custom orthotics today.    Review of Systems As above    Objective:   Physical Exam  Well-developed, well-nourished.  No acute distress.  Examination of his feet in the standing position shows bilateral pes planus.  No swelling.  Good pulses.  Gait analysis shows slight pronation of his feet even with the custom orthotics in place.      Assessment & Plan:   Bilateral foot pain  Although we discussed the possibility of new custom orthotics we will hold on that today.  Instead, him would like to try using his custom orthotic in his running shoes and placing them on top of the original shoe insert to see if this will give him more cushion.  I explained that this may be uncomfortable and if that is the case then he is to return to the office for new custom orthotics.  I also explained that although new custom orthotics will provide him with additional cushioning I cannot guarantee that that is the cause of his current pain.  Time spent with him today discussing all of this was 20 minutes in duration.  This note was dictated using Dragon naturally speaking software and may contain errors in syntax, spelling, or content which have not been identified prior to signing this note.

## 2022-06-06 ENCOUNTER — Encounter: Payer: Self-pay | Admitting: Cardiovascular Disease

## 2022-06-07 ENCOUNTER — Ambulatory Visit
Admission: RE | Admit: 2022-06-07 | Discharge: 2022-06-07 | Disposition: A | Discharge status: Home / Self Care | Payer: BC Managed Care – PPO | Source: Ambulatory Visit | Attending: Internal Medicine | Admitting: Internal Medicine

## 2022-06-07 ENCOUNTER — Other Ambulatory Visit: Payer: Self-pay | Admitting: Internal Medicine

## 2022-06-07 DIAGNOSIS — I700 Atherosclerosis of aorta: Secondary | ICD-10-CM | POA: Diagnosis not present

## 2022-06-07 DIAGNOSIS — R1031 Right lower quadrant pain: Secondary | ICD-10-CM

## 2022-06-07 DIAGNOSIS — R109 Unspecified abdominal pain: Secondary | ICD-10-CM | POA: Diagnosis not present

## 2022-06-07 MED ORDER — IOPAMIDOL (ISOVUE-300) INJECTION 61%
100.0000 mL | Freq: Once | INTRAVENOUS | Status: AC | PRN
  Administered 2022-06-07: 100 mL via INTRAVENOUS

## 2022-06-13 DIAGNOSIS — Z1339 Encounter for screening examination for other mental health and behavioral disorders: Secondary | ICD-10-CM | POA: Diagnosis not present

## 2022-06-13 DIAGNOSIS — R1031 Right lower quadrant pain: Secondary | ICD-10-CM | POA: Diagnosis not present

## 2022-06-17 DIAGNOSIS — K200 Eosinophilic esophagitis: Secondary | ICD-10-CM | POA: Diagnosis not present

## 2022-07-08 DIAGNOSIS — C61 Malignant neoplasm of prostate: Secondary | ICD-10-CM | POA: Diagnosis not present

## 2022-08-05 DIAGNOSIS — D239 Other benign neoplasm of skin, unspecified: Secondary | ICD-10-CM | POA: Diagnosis not present

## 2022-08-05 DIAGNOSIS — L570 Actinic keratosis: Secondary | ICD-10-CM | POA: Diagnosis not present

## 2022-09-05 ENCOUNTER — Ambulatory Visit: Payer: BC Managed Care – PPO | Attending: Cardiovascular Disease

## 2022-09-05 DIAGNOSIS — E782 Mixed hyperlipidemia: Secondary | ICD-10-CM | POA: Diagnosis not present

## 2022-09-05 DIAGNOSIS — Q254 Congenital malformation of aorta unspecified: Secondary | ICD-10-CM | POA: Diagnosis not present

## 2022-09-05 LAB — LIPID PANEL
Chol/HDL Ratio: 2.4 ratio (ref 0.0–5.0)
Cholesterol, Total: 111 mg/dL (ref 100–199)
HDL: 47 mg/dL (ref >39)
LDL Chol Calc (NIH): 50 mg/dL (ref 0–99)
Triglycerides: 68 mg/dL (ref 0–149)
VLDL Cholesterol Cal: 14 mg/dL (ref 5–40)

## 2022-09-05 LAB — ALT: ALT: 18 IU/L (ref 0–44)

## 2022-09-19 DIAGNOSIS — K2289 Other specified disease of esophagus: Secondary | ICD-10-CM | POA: Diagnosis not present

## 2022-09-19 DIAGNOSIS — K2970 Gastritis, unspecified, without bleeding: Secondary | ICD-10-CM | POA: Diagnosis not present

## 2022-09-19 DIAGNOSIS — Q394 Esophageal web: Secondary | ICD-10-CM | POA: Diagnosis not present

## 2022-09-19 DIAGNOSIS — R1310 Dysphagia, unspecified: Secondary | ICD-10-CM | POA: Diagnosis not present

## 2022-09-19 DIAGNOSIS — K200 Eosinophilic esophagitis: Secondary | ICD-10-CM | POA: Diagnosis not present

## 2022-11-08 DIAGNOSIS — K200 Eosinophilic esophagitis: Secondary | ICD-10-CM | POA: Diagnosis not present

## 2022-11-20 DIAGNOSIS — H401313 Pigmentary glaucoma, right eye, severe stage: Secondary | ICD-10-CM | POA: Diagnosis not present

## 2022-11-20 DIAGNOSIS — H401321 Pigmentary glaucoma, left eye, mild stage: Secondary | ICD-10-CM | POA: Diagnosis not present

## 2022-12-08 ENCOUNTER — Encounter: Payer: Self-pay | Admitting: Cardiovascular Disease

## 2022-12-08 NOTE — Progress Notes (Signed)
Cardiology Office Note:    Date:  12/09/2022   ID:  Ian White, DOB 1956/02/18, MRN [id number]  PCP:  Charlane Ferretti, DO   [facility] Providers Cardiologist: Tyriana Helmkamp  }    Referring MD: Charlane Ferretti, DO   Chief Complaint  Patient presents with   Hyperlipidemia     History of Present Illness:   Seen with wife , Ian White is a 66 y.o. male with a hx of prostate cancer  Tim had a screening coronary artery calcium score which revealed coronary artery calcium score of 650.  This places him in in the 88th percentile for age/gender matched controls.  He was incidentally noted to have mildly dilated ascending thoracic aorta 4.3 cm.  No CP or dyspnea - even with intense exercise . Hikes  Planning on doing [place] - 107 miles over 12 days .   Is runner , Ran a 25K race in Jan.  [place] on his chest and had CP following the fall.  Typically runs 5 miles a day, 4 days a week,  then occasionally runs a 10 mile   Father and mother had heart disease . Mother had a coronary stent,  MVP , MV repair - died of lymphoma Father had parkinsons, CAD late in his life   Mother parents died in their 42s   Aunt had CABG in her 23's  Works as an Airline pilot for [employer].    Dec. 11, 2023 Ian White is seen for follow up of his HLD CAC score is 650 - 88th % for age / sex matched controls Asc. Aorta is 4.3 cm  CTA of the aorta is scheduled for ay 2024  Strong famiy hx of CAD  Is an avid runner  Did [place]  -  105 miles with 30,000 + miles of elevation ,  hiked most of the way .   No CP ,  Had lots of anxiety   Still running  Lipids Sept. 2023 Chol -= 111 HDL = 47 Trigs = 68 LDL is 50   Lp(a) today      Past Medical History:  Diagnosis Date   GERD (gastroesophageal reflux disease)    Glaucoma    bilateral   Prostate cancer (HCC)     Past Surgical History:  Procedure Laterality Date   HERNIA REPAIR     PROSTATECTOMY  2015    due to cancer    Current Medications: Current Meds  Medication Sig   atorvastatin (LIPITOR) 40 MG tablet Take 40 mg by mouth daily.   LATANOPROST OP Apply to eye daily.   timolol (TIMOPTIC) 0.5 % ophthalmic solution    TIMOLOL HEMIHYDRATE OP Apply to eye as directed.   [DISCONTINUED] sildenafil (REVATIO) 20 MG tablet Take by mouth as directed.     Allergies:   Grass pollen(k-o-r-t-swt vern)   Social History   Socioeconomic History   Marital status: Married    Spouse name: Not on file   Number of children: Not on file   Years of education: Not on file   Highest education level: Not on file  Occupational History   Not on file  Tobacco Use   Smoking status: Never   Smokeless tobacco: Never  Substance and Sexual Activity   Alcohol use: Yes    Comment: monthly or less   Drug use: Not on file   Sexual activity: Not on file  Other Topics Concern   Not on file  Social History Narrative   Not on file   Social Determinants of Health   Financial Resource Strain: Not on file  Food Insecurity: Not on file  Transportation Needs: Not on file  Physical Activity: Not on file  Stress: Not on file  Social Connections: Not on file     Family History: The patient's family history includes Cancer in his father; Diabetes in his brother; Glaucoma in his father; Heart disease in his father and mother; Hypertension in his brother; Kidney disease in his brother; Lymphoma in his mother; Parkinson's disease in his father; Schizophrenia in his brother; Thyroid disease in his sister.  ROS:   Please see the history of present illness.     All other systems reviewed and are negative.  EKGs/Labs/Other Studies Reviewed:    The following studies were reviewed today:    EKG:     Recent Labs: 09/05/2022: ALT 18  Recent Lipid Panel    Component Value Date/Time   CHOL 111 09/05/2022 0747   TRIG 68 09/05/2022 0747   HDL 47 09/05/2022 0747   CHOLHDL 2.4 09/05/2022 0747   LDLCALC 50  09/05/2022 0747     Risk Assessment/Calculations:         Physical Exam:    Physical Exam: Blood pressure (!) 120/90, pulse (!) 43, height 5\' 10"  (1.778 m), weight 166 lb 3.2 oz (75.4 kg), SpO2 95 %.      GEN:  Well nourished, well developed in no acute distress HEENT: Normal NECK: No JVD; No carotid bruits LYMPHATICS: No lymphadenopathy CARDIAC: RRR , no murmurs, rubs, gallops RESPIRATORY:  Clear to auscultation without rales, wheezing or rhonchi  ABDOMEN: Soft, non-tender, non-distended MUSCULOSKELETAL:  No edema; No deformity  SKIN: Warm and dry NEUROLOGIC:  Alert and oriented x 3   ASSESSMENT:    1. Mixed hyperlipidemia   2. Coronary artery calcification     PLAN:       Coronary artery calcification:  his risk CAC of 650 .  LDL goal is 50-70 .    Last LDL is 50  Will get Lp(a) today  Cont atorvastatin    2.  Dilated ascending aorta :   4.3 cm  .   Has CTA of aorta scheduled for May, 2024            Medication Adjustments/Labs and Tests Ordered: Current medicines are reviewed at length with the patient today.  Concerns regarding medicines are outlined above.  Orders Placed This Encounter  Procedures   Lipoprotein A (LPA)   No orders of the defined types were placed in this encounter.    Patient Instructions  Medication Instructions:  Your physician recommends that you continue on your current medications as directed. Please refer to the Current Medication list given to you today.  *If you need a refill on your cardiac medications before your next appointment, please call your pharmacy*   Lab Work: LP(a) today If you have labs (blood work) drawn today and your tests are completely normal, you will receive your results only by: MyChart Message (if you have MyChart) OR A paper copy in the mail If you have any lab test that is abnormal or we need to change your treatment, we will call you to review the  results.   Testing/Procedures: NONE   Follow-Up: At [facility], you and your health needs are our priority.  As part of our continuing mission to provide you with exceptional heart care, we have created designated Provider Care  Teams.  These Care Teams include your primary Cardiologist (physician) and Advanced Practice Providers (APPs -  Physician Assistants and Nurse Practitioners) who all work together to provide you with the care you need, when you need it.  We recommend signing up for the patient portal called "MyChart".  Sign up information is provided on this After Visit Summary.  MyChart is used to connect with patients for Virtual Visits (Telemedicine).  Patients are able to view lab/test results, encounter notes, upcoming appointments, etc.  Non-urgent messages can be sent to your provider as well.   To learn more about what you can do with MyChart, go to [email or web address].    Your next appointment:   1 year(s)  The format for your next appointment:   In Person  Provider:   Kristeen Miss, MD    Important Information About Sugar         Signed, Kristeen Miss, MD  12/09/2022 9:54 AM    [facility]

## 2022-12-09 ENCOUNTER — Ambulatory Visit: Payer: BC Managed Care – PPO | Attending: Cardiovascular Disease | Admitting: Cardiovascular Disease

## 2022-12-09 ENCOUNTER — Encounter: Payer: Self-pay | Admitting: Cardiovascular Disease

## 2022-12-09 VITALS — BP 120/90 | HR 43 | Ht 70.0 in | Wt 166.2 lb

## 2022-12-09 DIAGNOSIS — I2584 Coronary atherosclerosis due to calcified coronary lesion: Secondary | ICD-10-CM

## 2022-12-09 DIAGNOSIS — E782 Mixed hyperlipidemia: Secondary | ICD-10-CM | POA: Diagnosis not present

## 2022-12-09 DIAGNOSIS — N393 Stress incontinence (female) (male): Secondary | ICD-10-CM | POA: Diagnosis not present

## 2022-12-09 DIAGNOSIS — I2510 Atherosclerotic heart disease of native coronary artery without angina pectoris: Secondary | ICD-10-CM | POA: Diagnosis not present

## 2022-12-09 DIAGNOSIS — C61 Malignant neoplasm of prostate: Secondary | ICD-10-CM | POA: Diagnosis not present

## 2022-12-09 NOTE — Patient Instructions (Signed)
Medication Instructions:  Your physician recommends that you continue on your current medications as directed. Please refer to the Current Medication list given to you today.  *If you need a refill on your cardiac medications before your next appointment, please call your pharmacy*   Lab Work: LP(a) today If you have labs (blood work) drawn today and your tests are completely normal, you will receive your results only by: [place] (if you have MyChart) OR A paper copy in the mail If you have any lab test that is abnormal or we need to change your treatment, we will call you to review the results.   Testing/Procedures: NONE   Follow-Up: At [facility], you and your health needs are our priority.  As part of our continuing mission to provide you with exceptional heart care, we have created designated Provider Care Teams.  These Care Teams include your primary Cardiologist (physician) and Advanced Practice Providers (APPs -  Physician Assistants and Nurse Practitioners) who all work together to provide you with the care you need, when you need it.  We recommend signing up for the patient portal called "MyChart".  Sign up information is provided on this After Visit Summary.  MyChart is used to connect with patients for Virtual Visits (Telemedicine).  Patients are able to view lab/test results, encounter notes, upcoming appointments, etc.  Non-urgent messages can be sent to your provider as well.   To learn more about what you can do with MyChart, go to [email or web address].    Your next appointment:   1 year(s)  The format for your next appointment:   In Person  Provider:   Mertie Moores, MD    Important Information About Sugar

## 2022-12-10 LAB — LIPOPROTEIN A (LPA): Lipoprotein (a): 35.1 nmol/L (ref <75.0)

## 2022-12-24 IMAGING — CT NM PET TUM IMG SKULL BASE T - THIGH
1 of 7 series · 1 of 25 positions shown · non-contrast
Comparison: Fluciclovine PET-CT scan 06/22/2021

CLINICAL DATA: Prostate carcinoma with biochemical recurrence. Post
prostatectomy. Elevated PSA.

EXAM:
NUCLEAR MEDICINE PET SKULL BASE TO THIGH
TECHNIQUE: 8.9 mCi F18 Piflufolastat (Pylarify) was injected intravenously.
Full-ring PET imaging was performed from the skull base to thigh
after the radiotracer. CT data was obtained and used for attenuation
correction and anatomic localization.

[Series 4: ct sk_thigh 5.0 bf37 · axial · 5.0mm · 0.98mm/px · 1 of 254 slices shown]
[im 203/254  brain]
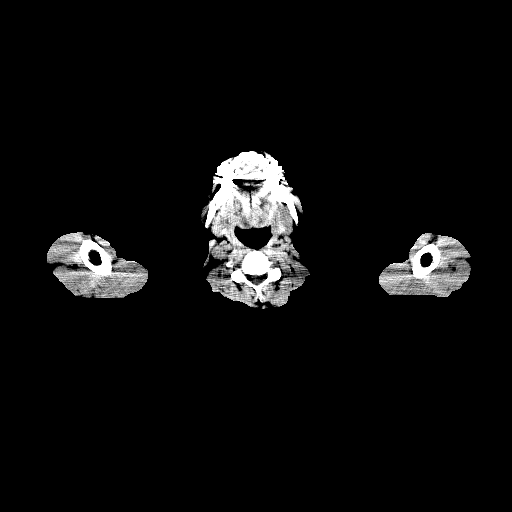

[1 of 25 positions shown; findings below may reference images not displayed]

FINDINGS: NECK

No radiotracer activity in neck lymph nodes.

Incidental CT finding: None

CHEST

No radiotracer accumulation within mediastinal or hilar lymph nodes.
No suspicious pulmonary nodules on the CT scan.

Incidental CT finding: None

ABDOMEN/PELVIS

Prostate: No focal activity in prostatectomy bed.

Lymph nodes: No abnormal radiotracer accumulation within pelvic or
abdominal nodes.

Liver: No evidence of liver metastasis

Incidental CT finding: None

SKELETON

No focal activity to suggest skeletal metastasis. Schmorl's node in
the endplate of the T11 vertebral body
IMPRESSION: 1. No evidence of prostate cancer nodal metastasis within the pelvis
or periaortic retroperitoneum.
2. No evidence of visceral metastasis or skeletal metastasis.
3. No evidence of local recurrence in the prostatectomy bed.

## 2023-01-20 DIAGNOSIS — L570 Actinic keratosis: Secondary | ICD-10-CM | POA: Diagnosis not present

## 2023-01-20 DIAGNOSIS — L218 Other seborrheic dermatitis: Secondary | ICD-10-CM | POA: Diagnosis not present

## 2023-01-20 DIAGNOSIS — D229 Melanocytic nevi, unspecified: Secondary | ICD-10-CM | POA: Diagnosis not present

## 2023-01-20 DIAGNOSIS — Z872 Personal history of diseases of the skin and subcutaneous tissue: Secondary | ICD-10-CM | POA: Diagnosis not present

## 2023-03-06 ENCOUNTER — Ambulatory Visit: Payer: BC Managed Care – PPO | Admitting: Sports Medicine

## 2023-03-06 ENCOUNTER — Ambulatory Visit
Admission: RE | Admit: 2023-03-06 | Discharge: 2023-03-06 | Disposition: A | Discharge status: Home / Self Care | Payer: BC Managed Care – PPO | Source: Ambulatory Visit | Attending: Sports Medicine | Admitting: Sports Medicine

## 2023-03-06 VITALS — BP 122/84 | Ht 69.5 in | Wt 162.0 lb

## 2023-03-06 DIAGNOSIS — M545 Low back pain, unspecified: Secondary | ICD-10-CM

## 2023-03-06 DIAGNOSIS — M4126 Other idiopathic scoliosis, lumbar region: Secondary | ICD-10-CM | POA: Diagnosis not present

## 2023-03-06 DIAGNOSIS — M48061 Spinal stenosis, lumbar region without neurogenic claudication: Secondary | ICD-10-CM | POA: Diagnosis not present

## 2023-03-06 DIAGNOSIS — M47817 Spondylosis without myelopathy or radiculopathy, lumbosacral region: Secondary | ICD-10-CM | POA: Diagnosis not present

## 2023-03-06 MED ORDER — MELOXICAM 15 MG PO TABS: ORAL_TABLET | ORAL | 0 refills | Status: DC

## 2023-03-06 NOTE — Progress Notes (Signed)
Ian White - 67 y.o. male MRN [id number]  Date of birth: [birth year]    CHIEF COMPLAINT:   Low back pain    SUBJECTIVE:   HPI:  Pleasant 67 year old male comes to clinic to be evaluated for right-sided low back pain.  He has had several low back pain flares in the past.  His first was 2 years ago.  The second was in January while standing at his desk.  His most recent 1 was on Saturday while he was doing some air squats as a warm up routine before running.  He is an avid runner.  He says his low back usually feels better after taking meloxicam and doing some flexion and extension exercises.  However, his back pain has been persistent since Saturday.  He has sharp shooting pain on the right that is made worse with any movement of the back.  The pain does radiate over to the left side sometimes.  He denies any radiation of the pain down the leg or any numbness or tingling or weakness in the legs.  Hips - no deformity. Nontender to palpation at ASIS, PSIS, greater trochanters bilaterally. Full ROM in hip flexion, internal rotation and external rotation bilaterally. 4/5 strength with resisted hip flexion bilaterally. 4/5 strength with resisted hip abduction bilaterally. 5/5 strength with resisted hip adduction bilaterally. Positive Straight leg raise and stenchfield bilaterally.   ROS:     See HPI  PERTINENT  PMH / PSH FH / / SH:  Past Medical, Surgical, Social, and Family History Reviewed & Updated in the EMR.  Pertinent findings include:  none  OBJECTIVE: BP 122/84   Ht 5' 9.5" (1.765 m)   Wt 162 lb (73.5 kg)   BMI 23.58 kg/m   Physical Exam:  Vital signs are reviewed.  GEN: Alert and oriented, NAD Pulm: Breathing unlabored PSY: normal mood, congruent affect  MSK: Lumbar Spine -no obvious deformity.  Nontender to palpation in the lumbar spinous processes, no bony step-off.  Very tender to palpation of the right lumbar paraspinal musculature with palpable spasm on the right.   Mildly tender to palpation of the left lumbar paraspinal musculature. ROM in flexion, extension, side bending, twisting limited to a few degrees due to pain.   Hips - no deformity. nontender to palpation at bilateral ASIS, PSIS, greater trochanters. Full ROM in flexion, internal and external rotation bilaterally. 4/5 strength with resisted abduction bilaterally. 4/5 strength with resisted flexion bilaterally. 5/5 strength with resisted adduction bilaterally. Positive straight leg raise and stenchfield that ilicit pain in the low back bilaterally. Negative log roll bilaterally. Positive Trendelenburg on the right. Neurovascularly intact distally.  ASSESSMENT & PLAN:  1. Lumbago without radiculopathy  - Most of his pain today seems to be muscular in nature. He also has weak hip abductors and a positive trendelenburg on the right. Will start him on a home exercise program focusing on back extension and hip abductor strengthening. Will also get x rays 2 views of lumbar spine to see if there are any degenerative issues at play here and to dictate the most effective home exercise plan longitudinally. Will refill his Meloxicam '15mg'$  daily since he had good relief with this in the past. Will follow up in 1-2 weeks after x rays to check progress and guide him in the best direction for longitudinal back exercises. If he is miserable from pain in the mean time , could call him in dose pak early next week if meloxicam is not effective.  Ian Kern, MD PGY-4, Sports Medicine Fellow Ian White  Patient seen and evaluated with the sports medicine fellow.  I agree with the above plan of care.  Follow-up in 1 to 2 weeks but I will message him through MyChart with x-ray results when available.  Addendum: March 10, 2023 I received a message from Ian White over the weekend that he was in severe pain.  I recommended that we trial a 6-day Sterapred Dosepak and I will also prescribe tramadol to be  taken as needed.  His x-ray does not show anything acute.  He does have some mild degenerative disc disease at L5-S1 and an underlying scoliotic curve.  Since he is in such severe discomfort, I also recommended consultation with Dr. Laurance White.

## 2023-03-08 ENCOUNTER — Encounter: Payer: Self-pay | Admitting: Sports Medicine

## 2023-03-10 ENCOUNTER — Other Ambulatory Visit: Payer: Self-pay

## 2023-03-10 DIAGNOSIS — M545 Low back pain, unspecified: Secondary | ICD-10-CM

## 2023-03-10 MED ORDER — TRAMADOL HCL 50 MG PO TABS: 50.0000 mg | ORAL_TABLET | Freq: Four times a day (QID) | ORAL | 0 refills | Status: DC | PRN

## 2023-03-10 MED ORDER — PREDNISONE 10 MG PO TABS: ORAL_TABLET | ORAL | 0 refills | Status: DC

## 2023-03-10 NOTE — Addendum Note (Signed)
Addended by: Lilia Argue R on: 03/10/2023 10:22 AM   Modules accepted: Orders

## 2023-03-13 ENCOUNTER — Ambulatory Visit: Payer: BC Managed Care – PPO | Admitting: Sports Medicine

## 2023-03-13 VITALS — BP 136/86 | Ht 69.0 in | Wt 160.0 lb

## 2023-03-13 DIAGNOSIS — M545 Low back pain, unspecified: Secondary | ICD-10-CM | POA: Diagnosis not present

## 2023-03-13 NOTE — Assessment & Plan Note (Addendum)
No red flag symptoms/cauda equina symptoms.  Most likely muscular strain that has now resolved after rest.  Currently taking meloxicam for pain relief.  Patient did not take steroid Dosepak been advised to keep this in case he has another flareup.  We discussed physical therapy/stretching exercises in depth.  Discussed avoiding deep extension exercises.  Discussed doing stretching after any kind of workout instead of before.  No radiculopathy on examination and no weakness seen on exam for L4 L5 S1. -Continue meloxicam as needed -Therapy stretches given in office -Consider formal PT referral if patient would like this in the future

## 2023-03-13 NOTE — Progress Notes (Signed)
SUBJECTIVE:   CHIEF COMPLAINT / HPI: Low back pain  Seen on 3/7 for right lower back pain that appears to be muscular in nature and had a meloxicam refill at that time. Friday night noted having 10 out of 10 back pain.  He had reach out to our office who prescribed a steroid Dosepak for him.  Friday night through Monday he stayed in bed all day and on Monday morning he started feeling better without taking the steroids.  Monday started walking around more and started gradually feeling better to today.  He currently feels tight still in his lower back but is able to get up and move much better than he previously was able to.  He feels like twisting motions make his back spasm.  He also feels like extension of his back feels worse than flexion.  Currently only taking meloxicam once a day which seems to be controlling his pain.  Denies any shooting pains down his legs currently but on Sunday said that the left leg had a shooting pain down from his back to his foot.  He has not had a recurrence of back pain.  Denies any groin pain currently.  Denies any bowel or bladder incontinence.  Denies any saddle anesthesia.  Denies any leg weakness.  He has been doing stretching exercises before running currently and would like more advice on this.     OBJECTIVE:   BP 136/86   Ht '5\' 9"'$  (1.753 m)   Wt 160 lb (72.6 kg)   BMI 23.63 kg/m   Gen: NAD, awake, alert and responsively to questions MSK: Lumbar spine-no obvious deformities, overall nontender to palpation in the lumbar spinous processes, no bony step-offs, mild tenderness to palpation around lumbar paraspinal musculature, range of motion improved from previous exam and able to flex and extend although extension comes with some pain  Hip:  - Inspection: No gross deformity - Palpation: No TTP, specifically none over greater trochanter b/l - ROM: Normal range of motion on Flexion, extension, abduction, and external rotation b/l (some limited internal  rotation of hip b/l but no pain) - Strength: Normal strength in all fields b/l - Neuro/vasc: NV intact distally b/l - Equal patellar (2+) and achilles (mild) reflexes b/l - Great toe no issues with extension against resistance   ASSESSMENT/PLAN:   Low back pain No red flag symptoms/cauda equina symptoms.  Most likely muscular strain that has now resolved after rest.  Currently taking meloxicam for pain relief.  Patient did not take steroid Dosepak been advised to keep this in case he has another flareup.  We discussed physical therapy/stretching exercises in depth.  Discussed avoiding deep extension exercises.  Discussed doing stretching after any kind of workout instead of before.  No radiculopathy on examination and no weakness seen on exam for L4 L5 S1. -Continue meloxicam as needed -Therapy stretches given in office -Consider formal PT referral if patient would like this in the future    Gerrit Heck, MD [place]   Patient seen and evaluated with the resident.  I agree with the above plan of care.  X-rays are reviewed.  He has some mild degenerative disc disease at L5-S1 but otherwise his lumbar spine looks fairly healthy.  The curve seen on his previous x-ray is likely secondary to muscle spasm and not true scoliosis.  Physical exam today does not demonstrate a rib hump to suggest scoliosis.  Given the fact that he has pain with extension  we will have him start Williams flexion exercises.  We did a comprehensive review of his other exercises that he has been doing and I think they are adequate.  I did caution him about the speed in which he is doing certain exercises which could certainly aggravate his back.  Since he elected not to start the prednisone or the tramadol, he may simply save those in case he needs them at a later date.  Also, we had recommended referral to [facility] this week when he was in such severe pain.  Since he is feeling  better, we will not need to pursue this consultation.  Follow-up as needed.  This note was dictated using Dragon naturally speaking software and may contain errors in syntax, spelling, or content which have not been identified prior to signing this note.

## 2023-03-18 DIAGNOSIS — H2513 Age-related nuclear cataract, bilateral: Secondary | ICD-10-CM | POA: Diagnosis not present

## 2023-03-18 DIAGNOSIS — H4423 Degenerative myopia, bilateral: Secondary | ICD-10-CM | POA: Diagnosis not present

## 2023-03-18 DIAGNOSIS — H401313 Pigmentary glaucoma, right eye, severe stage: Secondary | ICD-10-CM | POA: Diagnosis not present

## 2023-03-18 DIAGNOSIS — H25013 Cortical age-related cataract, bilateral: Secondary | ICD-10-CM | POA: Diagnosis not present

## 2023-03-18 DIAGNOSIS — H5213 Myopia, bilateral: Secondary | ICD-10-CM | POA: Diagnosis not present

## 2023-03-18 DIAGNOSIS — H524 Presbyopia: Secondary | ICD-10-CM | POA: Diagnosis not present

## 2023-03-25 ENCOUNTER — Ambulatory Visit: Payer: BC Managed Care – PPO | Admitting: Sports Medicine

## 2023-03-25 VITALS — BP 134/86 | Ht 69.75 in | Wt 160.0 lb

## 2023-03-25 DIAGNOSIS — M2142 Flat foot [pes planus] (acquired), left foot: Secondary | ICD-10-CM | POA: Diagnosis not present

## 2023-03-25 DIAGNOSIS — M2141 Flat foot [pes planus] (acquired), right foot: Secondary | ICD-10-CM | POA: Diagnosis not present

## 2023-03-26 NOTE — Progress Notes (Signed)
Patient ID: Ian White, male   DOB: 15-Jul-1956, 67 y.o.   MRN: [id number]  Ian White presents today for new custom orthotics.  His low back pain has improved greatly.  His current orthotics are almost 65 years old.  He has a history of pronation requiring good arch support.  New orthotics were created as below.  Evaluation of his gait did show fairly good correction of his pronation although he does still pronate slightly on the left.  At some point we may need to consider adding a scaphoid pad.  I also asked him to bring his old orthotics to the office at his convenience.  If they are still in good shape yet are not providing adequate arch support, we can also consider adding scaphoid pads to these.  Patient was fitted for a : standard, cushioned, semi-rigid orthotic. The orthotic was heated and afterward the patient stood on the orthotic blank positioned on the orthotic stand. The patient was positioned in subtalar neutral position and 10 degrees of ankle dorsiflexion in a weight bearing stance. After completion of molding, a stable base was applied to the orthotic blank. The blank was ground to a stable position for weight bearing. Size: 10  Base: Blue EVA which was shaved down on the left to help accommodate his left leg being a centimeter longer than the right.  Posting: None Additional orthotic padding: None  This note was dictated using Dragon naturally speaking software and may contain errors in syntax, spelling, or content which have not been identified prior to signing this note.

## 2023-04-10 DIAGNOSIS — H25011 Cortical age-related cataract, right eye: Secondary | ICD-10-CM | POA: Diagnosis not present

## 2023-04-10 DIAGNOSIS — H269 Unspecified cataract: Secondary | ICD-10-CM | POA: Diagnosis not present

## 2023-04-10 DIAGNOSIS — H2511 Age-related nuclear cataract, right eye: Secondary | ICD-10-CM | POA: Diagnosis not present

## 2023-04-17 DIAGNOSIS — H2512 Age-related nuclear cataract, left eye: Secondary | ICD-10-CM | POA: Diagnosis not present

## 2023-04-17 DIAGNOSIS — H269 Unspecified cataract: Secondary | ICD-10-CM | POA: Diagnosis not present

## 2023-04-17 DIAGNOSIS — H25812 Combined forms of age-related cataract, left eye: Secondary | ICD-10-CM | POA: Diagnosis not present

## 2023-04-17 DIAGNOSIS — H25012 Cortical age-related cataract, left eye: Secondary | ICD-10-CM | POA: Diagnosis not present

## 2023-05-05 ENCOUNTER — Encounter (HOSPITAL_BASED_OUTPATIENT_CLINIC_OR_DEPARTMENT_OTHER): Payer: Self-pay

## 2023-05-05 ENCOUNTER — Ambulatory Visit (HOSPITAL_BASED_OUTPATIENT_CLINIC_OR_DEPARTMENT_OTHER)
Admission: RE | Admit: 2023-05-05 | Discharge: 2023-05-05 | Disposition: A | Discharge status: Home / Self Care | Payer: BC Managed Care – PPO | Source: Ambulatory Visit | Attending: Cardiovascular Disease | Admitting: Cardiovascular Disease

## 2023-05-05 DIAGNOSIS — Q254 Congenital malformation of aorta unspecified: Secondary | ICD-10-CM | POA: Diagnosis not present

## 2023-05-05 DIAGNOSIS — I7121 Aneurysm of the ascending aorta, without rupture: Secondary | ICD-10-CM | POA: Diagnosis not present

## 2023-05-05 DIAGNOSIS — E782 Mixed hyperlipidemia: Secondary | ICD-10-CM

## 2023-05-05 MED ORDER — IOHEXOL 350 MG/ML SOLN
100.0000 mL | Freq: Once | INTRAVENOUS | Status: AC | PRN
  Administered 2023-05-05: 75 mL via INTRAVENOUS

## 2023-05-07 ENCOUNTER — Telehealth: Payer: Self-pay | Admitting: Cardiovascular Disease

## 2023-05-07 DIAGNOSIS — I7121 Aneurysm of the ascending aorta, without rupture: Secondary | ICD-10-CM

## 2023-05-07 NOTE — Telephone Encounter (Signed)
-----   Message from Vesta Mixer, MD sent at 05/06/2023  5:41 PM EDT ----- His asc. Aortic aneurism is stable at 4.3 cm .  Continue to observe Repeat aorta CTA in 1 year .

## 2023-05-07 NOTE — Telephone Encounter (Signed)
Patient has viewed Dr Harvie Bridge comments via MyChart and repeat order placed for next year at this time.

## 2023-05-08 ENCOUNTER — Encounter: Payer: Self-pay | Admitting: Cardiovascular Disease

## 2023-05-08 ENCOUNTER — Telehealth: Payer: Self-pay | Admitting: Cardiovascular Disease

## 2023-05-08 NOTE — Telephone Encounter (Signed)
Patient stated he had a Lipoprotein A (LPA) test with [facility] and they told him this is an experimental test and have billed him accordingly.  Patient wants to confirm if he should be billed for this.

## 2023-05-29 DIAGNOSIS — Z125 Encounter for screening for malignant neoplasm of prostate: Secondary | ICD-10-CM | POA: Diagnosis not present

## 2023-05-29 DIAGNOSIS — K219 Gastro-esophageal reflux disease without esophagitis: Secondary | ICD-10-CM | POA: Diagnosis not present

## 2023-05-29 DIAGNOSIS — E291 Testicular hypofunction: Secondary | ICD-10-CM | POA: Diagnosis not present

## 2023-05-29 DIAGNOSIS — E785 Hyperlipidemia, unspecified: Secondary | ICD-10-CM | POA: Diagnosis not present

## 2023-05-29 DIAGNOSIS — R7989 Other specified abnormal findings of blood chemistry: Secondary | ICD-10-CM | POA: Diagnosis not present

## 2023-06-04 DIAGNOSIS — C61 Malignant neoplasm of prostate: Secondary | ICD-10-CM | POA: Diagnosis not present

## 2023-06-05 DIAGNOSIS — Z0000 Encounter for general adult medical examination without abnormal findings: Secondary | ICD-10-CM | POA: Diagnosis not present

## 2023-06-05 DIAGNOSIS — Z23 Encounter for immunization: Secondary | ICD-10-CM | POA: Diagnosis not present

## 2023-06-05 DIAGNOSIS — I712 Thoracic aortic aneurysm, without rupture, unspecified: Secondary | ICD-10-CM | POA: Diagnosis not present

## 2023-06-11 DIAGNOSIS — N393 Stress incontinence (female) (male): Secondary | ICD-10-CM

## 2023-06-11 DIAGNOSIS — C61 Malignant neoplasm of prostate: Secondary | ICD-10-CM | POA: Diagnosis not present

## 2023-06-11 HISTORY — DX: Stress incontinence (female) (male): N39.3

## 2023-06-18 IMAGING — CR DG WRIST COMPLETE 3+V*L*
4 series · 4 of 4 positions shown · non-contrast
Comparison: None.

CLINICAL DATA: Left medial/lateral wrist pain related to a fall 3
weeks ago.

EXAM:
LEFT WRIST - COMPLETE 3+ VIEW

[x wrist pa left]
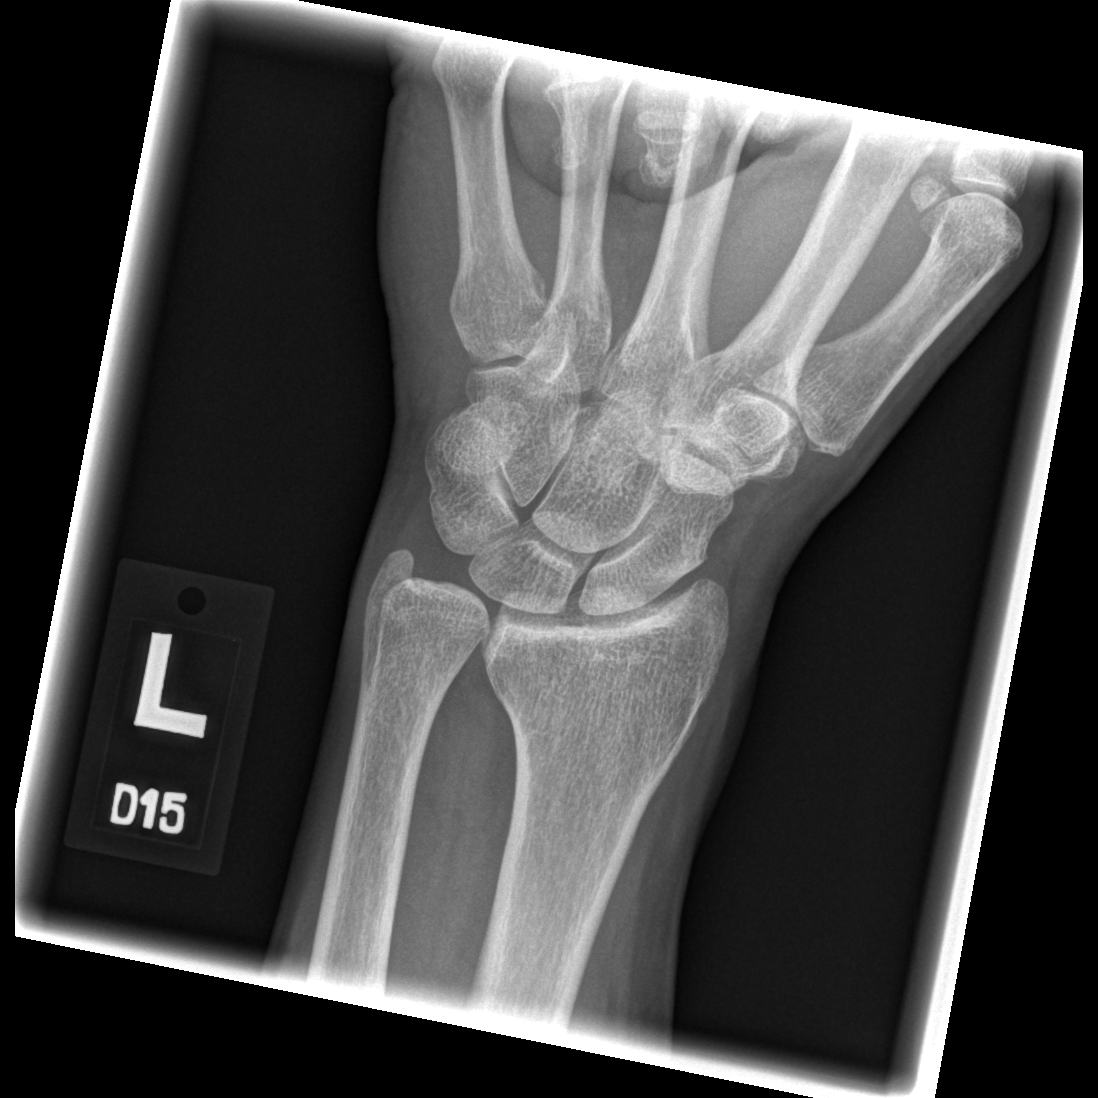

[x wrist obl left]
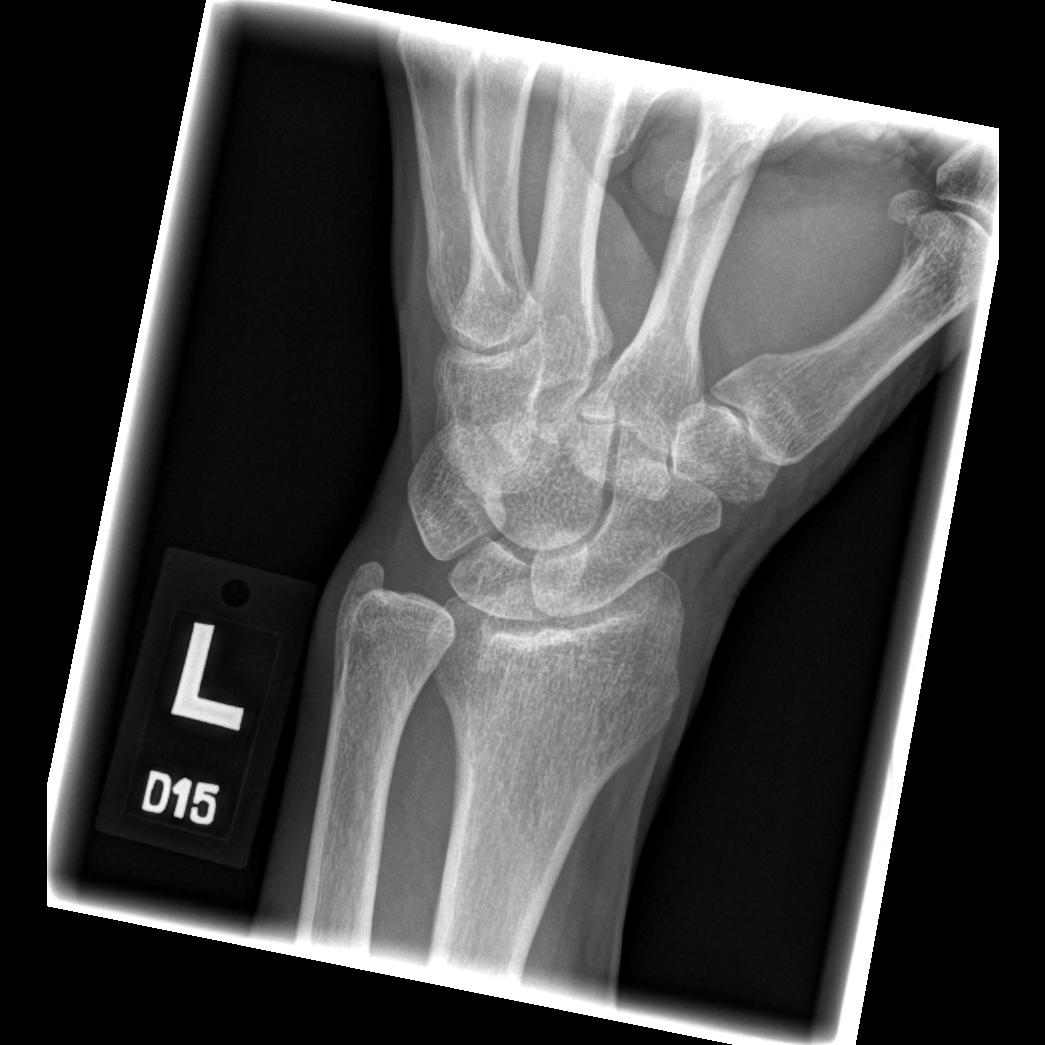

[x wrist lat left]
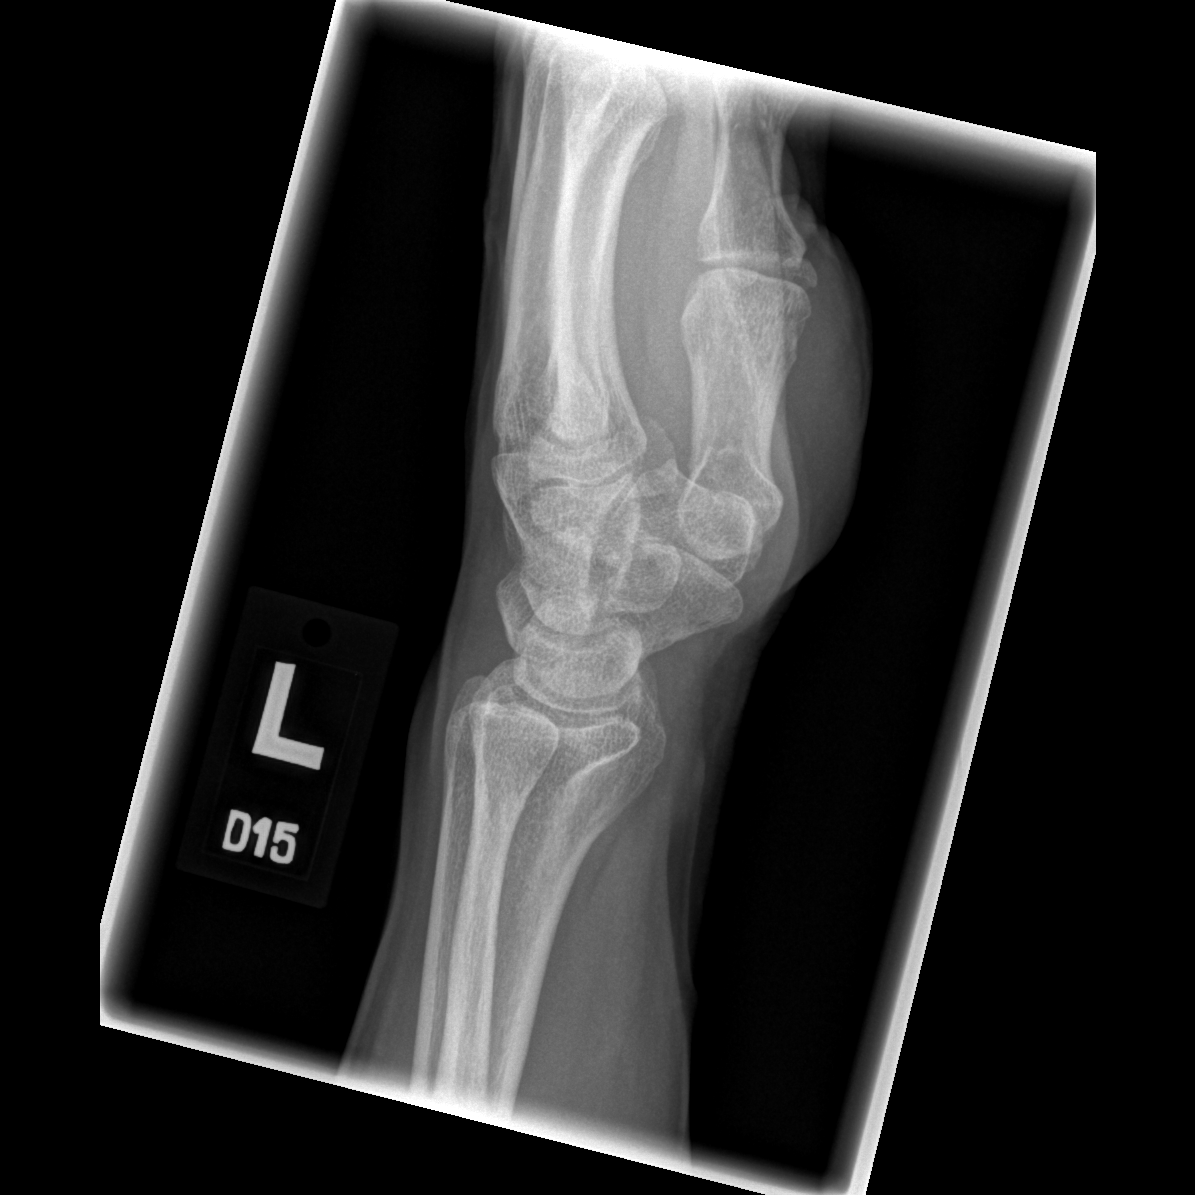

[x navicular]
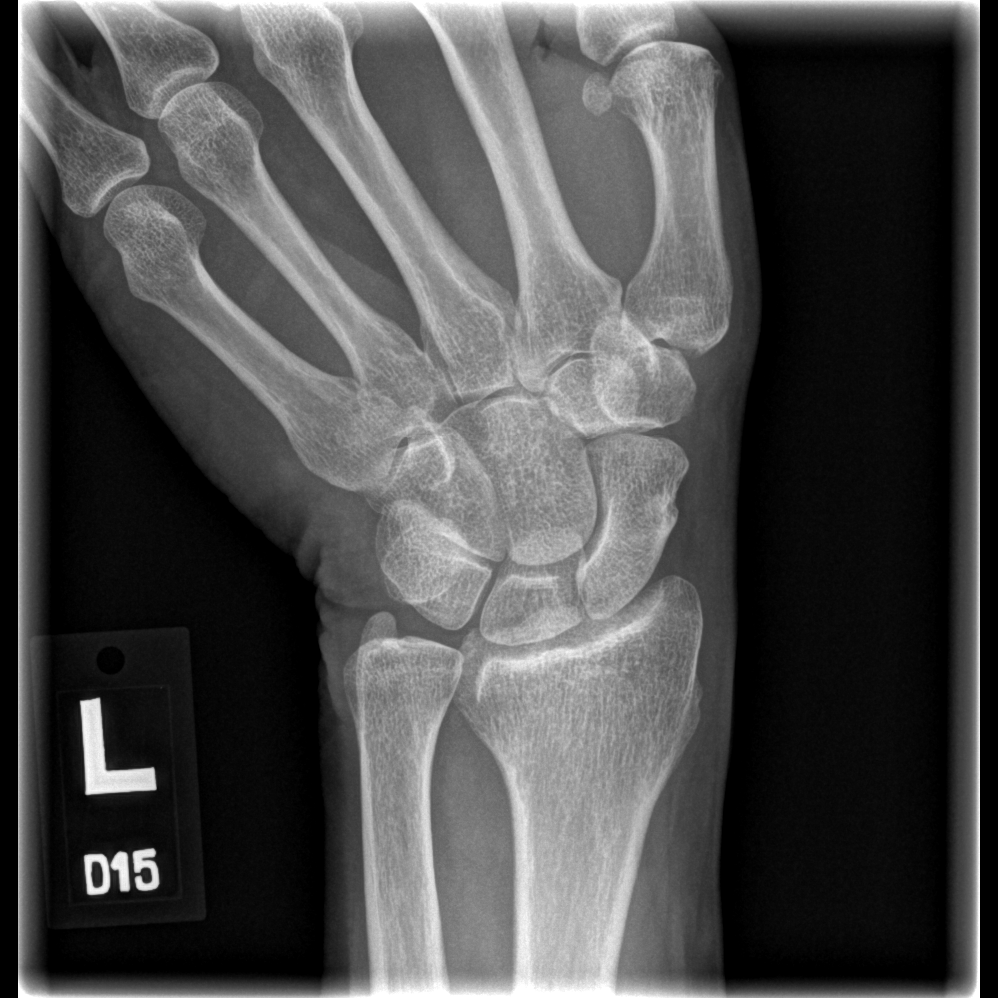

[4 of 4 positions shown; findings below may reference images not displayed]

FINDINGS: No fracture or dislocation is seen. Minimal bony spurs seen in the
first carpometacarpal joint.
IMPRESSION: No recent fracture or dislocation is seen in the left wrist.

Degenerative changes are noted with small bony spurs seen in the
first carpometacarpal joint.

## 2023-06-20 ENCOUNTER — Other Ambulatory Visit (HOSPITAL_COMMUNITY): Payer: Self-pay | Admitting: Urology

## 2023-06-20 DIAGNOSIS — R9721 Rising PSA following treatment for malignant neoplasm of prostate: Secondary | ICD-10-CM

## 2023-06-20 DIAGNOSIS — C61 Malignant neoplasm of prostate: Secondary | ICD-10-CM

## 2023-07-09 ENCOUNTER — Ambulatory Visit (HOSPITAL_COMMUNITY)
Admission: RE | Admit: 2023-07-09 | Discharge: 2023-07-09 | Disposition: A | Discharge status: Home / Self Care | Payer: BC Managed Care – PPO | Source: Ambulatory Visit | Attending: Urology | Admitting: Urology

## 2023-07-09 DIAGNOSIS — R9721 Rising PSA following treatment for malignant neoplasm of prostate: Secondary | ICD-10-CM | POA: Insufficient documentation

## 2023-07-09 DIAGNOSIS — C61 Malignant neoplasm of prostate: Secondary | ICD-10-CM | POA: Insufficient documentation

## 2023-07-09 MED ORDER — PIFLIFOLASTAT F 18 (PYLARIFY) INJECTION
9.0000 | Freq: Once | INTRAVENOUS | Status: AC
  Administered 2023-07-09: 9.5 via INTRAVENOUS

## 2023-09-24 DIAGNOSIS — H401313 Pigmentary glaucoma, right eye, severe stage: Secondary | ICD-10-CM | POA: Diagnosis not present

## 2023-10-01 DIAGNOSIS — C61 Malignant neoplasm of prostate: Secondary | ICD-10-CM | POA: Diagnosis not present

## 2023-10-08 DIAGNOSIS — C61 Malignant neoplasm of prostate: Secondary | ICD-10-CM | POA: Diagnosis not present

## 2023-10-08 DIAGNOSIS — N393 Stress incontinence (female) (male): Secondary | ICD-10-CM | POA: Diagnosis not present

## 2023-10-14 ENCOUNTER — Telehealth: Payer: Self-pay | Admitting: Radiation Oncology

## 2023-10-14 NOTE — Telephone Encounter (Signed)
10/15 Received referral but no md's notes and/or pathology/image reports attached.  Left voicemail for Junious Dresser [facility]) so they are aware to resend referral with notes needed, before schedule patient.

## 2023-10-28 ENCOUNTER — Encounter: Payer: Self-pay | Admitting: Radiation Oncology

## 2023-10-28 NOTE — Progress Notes (Signed)
GU Location of Tumor / Histology: Prostate Ca  PSA  4.32 on 10/02/2023 PSA  4.75 on 06/05/2023 PSA  4.168 on 05/19/2023  Perineal Prostatectomy (2015)  Biopsy    07/09/2023 Dr. Bjorn Pippin NM PET (PSMA) Skull to Mid Thigh CLINICAL DATA:  Prostate carcinoma with biochemical recurrence. Radical prostatectomy 2016. Rising PSA. PSA equal 4.7 on 06/04/2023.  IMPRESSION: 1. No evidence of local prostate carcinoma recurrence in the prostate bed. 2. No evidence of metastatic adenopathy in the pelvis or periaortic retroperitoneum. 3. No evidence of visceral metastasis or skeletal metastasis.   Past/Anticipated interventions by urology, if any:  Dr. Bjorn Pippin   Past/Anticipated interventions by medical oncology, if any: NA  Weight changes, if any: No  IPSS:  9 SHIM: 5  Bowel/Bladder complaints, if any:  Urinary leakage minor irritation when urinates. No bowel issues.  Nausea/Vomiting, if any: No  Pain issues, if any:  0/10  SAFETY ISSUES: Prior radiation? No Pacemaker/ICD? No Possible current pregnancy? Male Is the patient on methotrexate? No  Current Complaints / other details:  Hernia repair with mesh thinks it could be cause for irritations 10 years ago.

## 2023-10-30 DIAGNOSIS — C61 Malignant neoplasm of prostate: Secondary | ICD-10-CM | POA: Insufficient documentation

## 2023-10-31 ENCOUNTER — Ambulatory Visit
Admission: RE | Admit: 2023-10-31 | Discharge: 2023-10-31 | Disposition: A | Discharge status: Home / Self Care | Payer: BC Managed Care – PPO | Source: Ambulatory Visit | Attending: Radiation Oncology | Admitting: Radiation Oncology

## 2023-10-31 ENCOUNTER — Encounter: Payer: Self-pay | Admitting: Radiation Oncology

## 2023-10-31 ENCOUNTER — Ambulatory Visit
Admission: RE | Admit: 2023-10-31 | Discharge: 2023-10-31 | Disposition: A | Discharge status: Home / Self Care | Payer: BC Managed Care – PPO | Source: Ambulatory Visit | Attending: Radiation Oncology

## 2023-10-31 VITALS — BP 118/82 | HR 50 | Temp 97.8°F | Resp 18 | Ht 70.0 in | Wt 164.2 lb

## 2023-10-31 DIAGNOSIS — Z79899 Other long term (current) drug therapy: Secondary | ICD-10-CM | POA: Insufficient documentation

## 2023-10-31 DIAGNOSIS — Z806 Family history of leukemia: Secondary | ICD-10-CM | POA: Diagnosis not present

## 2023-10-31 DIAGNOSIS — Z8042 Family history of malignant neoplasm of prostate: Secondary | ICD-10-CM | POA: Insufficient documentation

## 2023-10-31 DIAGNOSIS — K219 Gastro-esophageal reflux disease without esophagitis: Secondary | ICD-10-CM | POA: Insufficient documentation

## 2023-10-31 DIAGNOSIS — C61 Malignant neoplasm of prostate: Secondary | ICD-10-CM | POA: Insufficient documentation

## 2023-10-31 DIAGNOSIS — Z191 Hormone sensitive malignancy status: Secondary | ICD-10-CM | POA: Diagnosis not present

## 2023-10-31 HISTORY — DX: Elevated prostate specific antigen (PSA): R97.20

## 2023-10-31 NOTE — Progress Notes (Signed)
Radiation Oncology         [phone] ________________________________  Initial Outpatient Consultation  Name: Ian White MRN: [id number]  Date: 10/31/2023  DOB: February 23, 1956  ON:GEXBMW, Eliberto Ivory DO  Bjorn Pippin, MD   REFERRING PHYSICIAN: Bjorn Pippin, MD  DIAGNOSIS: 67 y.o. gentleman with a rising, detectable postoperative PSA of 4.32 s/p RALP 12/2014 for Stage pT2cNX, Gleason 3+4 prostate cancer    ICD-10-CM   1. Local recurrence of malignant neoplasm of prostate (HCC)  C61       HISTORY OF PRESENT ILLNESS: Ian White is a 67 y.o. male with a diagnosis of locally recurrent prostate cancer. He was initially diagnosed with Gleason 4+3 prostate cancer on 11/16/14 with a PSA of 12.19. He opted to proceed with RPP on 01/19/15 under the care of Dr. Cleatrice Burke. Pathology revealed pT2cNx, Gleason 3+4 prostatic adenocarcinoma with negative margins and no involvement of the seminal vesicles. His postoperative PSA was very low detectable at 0.05 but thought to be secondary to some residual benign prostate tissue that was intentionally left behind at the time of surgery.  Since that time, his PSA has continued to rise despite multiple MRI scans and Axumin PET scan for restaging not showing any evidence of disease recurrence or metastasis.  He transferred his care to Dr. Annabell Howells in 06/2021, for convenience of location. The PSA reached 2.3 by 07/2021 and a restaging PSMA PET scan at that time was negative for recurrence or metastatic disease. His PSA has continued to rise, reaching 4.7 in 05/2023. This prompted a repeat PSMA PET scan which was performed on 07/09/23, and again, was negative for recurrence or metastatic disease per radiology report. His PSA showed a slight decrease to 4.32 on 10/01/23, however, on further review of the PET imaging, Dr. Annabell Howells felt that there was some abnormal uptake at the right anastomotic region, correlating to a palpable nodule felt on exam. Unable to identify a correlate  lesion on previous MRI imaging.   The patient reviewed the PSA and imaging results with his urologist, Dr. Annabell Howells and he has kindly been referred today for discussion of potential radiation treatment options.   PREVIOUS RADIATION THERAPY: No  PAST MEDICAL HISTORY:  Past Medical History:  Diagnosis Date   Elevated PSA    GERD (gastroesophageal reflux disease)    Glaucoma    bilateral   Prostate cancer (HCC)    Stress incontinence 06/11/2023      PAST SURGICAL HISTORY: Past Surgical History:  Procedure Laterality Date   HERNIA REPAIR     PROSTATECTOMY  2015   due to cancer    FAMILY HISTORY:  Family History  Problem Relation Age of Onset   Lymphoma Mother    Heart disease Mother        stent, bypass   Heart disease Father    Cancer Father        prostate   Parkinson's disease Father    Glaucoma Father    Thyroid disease Sister    Hypertension Brother        ?   Diabetes Brother    Kidney disease Brother    Schizophrenia Brother     SOCIAL HISTORY: He is married and lives with his wife in [place]. He is an avid runner. Social History   Socioeconomic History   Marital status: Married    Spouse name: Not on file   Number of children: Not on file   Years of education: Not on file   Highest education level: Not  on file  Occupational History   Not on file  Tobacco Use   Smoking status: Never   Smokeless tobacco: Never  Substance and Sexual Activity   Alcohol use: Yes    Comment: monthly or less   Drug use: Never   Sexual activity: Not on file  Other Topics Concern   Not on file  Social History Narrative   Not on file   Social Determinants of Health   Financial Resource Strain: Not on file  Food Insecurity: Not on file  Transportation Needs: Not on file  Physical Activity: Sufficiently Active (11/03/2019)   Received from [facility] prior to 03/01/2023., [facility] visits prior to 03/01/2023.    Exercise Vital Sign    Days of Exercise per Week: 6 days    Minutes of Exercise per Session: 60 min  Stress: Not on file  Social Connections: Not on file  Intimate Partner Violence: Not on file    ALLERGIES: Grass pollen([id number]-o-r-t-swt vern)  MEDICATIONS:  Current Outpatient Medications  Medication Sig Dispense Refill   atorvastatin (LIPITOR) 40 MG tablet Take 40 mg by mouth daily.     LATANOPROST OP Apply to eye daily.     timolol (TIMOPTIC) 0.5 % ophthalmic solution      No current facility-administered medications for this encounter.    REVIEW OF SYSTEMS:  On review of systems, the patient reports that he is doing well overall. He denies any chest pain, shortness of breath, cough, fevers, chills, night sweats, unintended weight changes. He denies any bowel disturbances, and denies abdominal pain, nausea or vomiting. He denies any new musculoskeletal or joint aches or pains. His IPSS was 9, indicating mild-moderate urinary symptoms. He does have some significant post-operative MUI. His SHIM was 5, indicating he has severe postoperative erectile dysfunction. A complete review of systems is obtained and is otherwise negative.    PHYSICAL EXAM:  Wt Readings from Last 3 Encounters:  03/25/23 160 lb (72.6 kg)  03/13/23 160 lb (72.6 kg)  03/06/23 162 lb (73.5 kg)   Temp Readings from Last 3 Encounters:  No data found for Temp   BP Readings from Last 3 Encounters:  03/25/23 134/86  03/13/23 136/86  03/06/23 122/84   Pulse Readings from Last 3 Encounters:  12/09/22 (!) 43  06/04/22 (!) 46    /10  In general this is a well appearing Caucasian man in no acute distress. He's alert and oriented x4 and appropriate throughout the examination. Cardiopulmonary assessment is negative for acute distress, and he exhibits normal effort.     KPS = 100  100 - Normal; no complaints; no evidence of disease. 90   - Able to carry on normal activity; minor signs or symptoms of disease. 80   -  Normal activity with effort; some signs or symptoms of disease. 72   - Cares for self; unable to carry on normal activity or to do active work. 60   - Requires occasional assistance, but is able to care for most of his personal needs. 50   - Requires considerable assistance and frequent medical care. 40   - Disabled; requires special care and assistance. 30   - Severely disabled; hospital admission is indicated although death not imminent. 20   - Very sick; hospital admission necessary; active supportive treatment necessary. 10   - Moribund; fatal processes progressing rapidly. 0     - Dead  Karnofsky DA, Abelmann WH, Craver LS and [address]  JH (1948) The use of the nitrogen mustards in the palliative treatment of carcinoma: with particular reference to bronchogenic carcinoma Cancer 1 634-56  LABORATORY DATA:  No results found for: "WBC", "HGB", "HCT", "MCV", "PLT" No results found for: "NA", "K", "CL", "CO2" Lab Results  Component Value Date   ALT 18 09/05/2022     RADIOGRAPHY: No results found.    IMPRESSION/PLAN: 1. 67 y.o. gentleman with a rising PSA of 4.32 s/p RALP 12/2014 for Stage pT2cNX, Gleason 3+4 prostate cancer Today we reviewed the findings and workup thus far.  We discussed the natural history of prostate cancer.  We reviewed the the implications of positive margins, extracapsular extension, and seminal vesicle involvement on the risk of prostate cancer recurrence. In his case, only perineural invasion was present but he has a rising, detectable postoperative PSA. We reviewed some of the evidence suggesting an advantage for patients who undergo salvage radiotherapy in this setting in terms of disease control and overall survival. We discussed radiation treatment directed to the prostatic fossa with regard to the logistics and delivery of external beam radiation treatment.  He was encouraged to ask questions that were answered to his stated satisfaction.  At the conclusion of  our conversation, the patient is undecided and would like to take some additional time to consider his options but opening towards moving forward with the recommended 7.5 weeks of salvage external beam therapy. We will share our discussion with Dr. Annabell Howells. The patient appears to have a good understanding of his disease and our treatment recommendations which are of curative intent and is in agreement with the stated plan.  Therefore, we will await his final decision and move forward with treatment planning accordingly, if he elects to proceed.  We enjoyed meeting him and his wife today and look forward to continuing to participate in his care.  We personally spent 70 minutes in this encounter including chart review, reviewing radiological studies, meeting face-to-face with the patient, entering orders and completing documentation.    Marguarite Arbour, PA-C    Margaretmary Dys, MD  [facility]  Radiation Oncology Direct Dial: [phone]  Fax: [phone] [email or web address]  Skype  [email or web address]   This document serves as a record of services personally performed by Margaretmary Dys, MD and Marcello Fennel, PA-C. It was created on their behalf by Mickie Bail, a trained medical scribe. The creation of this record is based on the scribe's personal observations and the provider's statements to them. This document has been checked and approved by the attending provider.

## 2023-10-31 NOTE — Progress Notes (Signed)
Introduced myself to the patient as the prostate nurse navigator.  No barriers to care identified at this time.  He is here to discuss his radiation treatment options.  I gave him my business card and asked him to call me with questions or concerns.  Verbalized understanding.  ?

## 2023-11-03 ENCOUNTER — Telehealth: Payer: Self-pay | Admitting: *Deleted

## 2023-11-03 DIAGNOSIS — C61 Malignant neoplasm of prostate: Secondary | ICD-10-CM | POA: Diagnosis not present

## 2023-11-03 DIAGNOSIS — Z191 Hormone sensitive malignancy status: Secondary | ICD-10-CM | POA: Diagnosis not present

## 2023-11-03 NOTE — Telephone Encounter (Signed)
CALLED PATIENT TO ASK IF HE WOULD BE WILLING TO COME FOR SIM ON 11-10-23- ARRIVAL TIME- 1:45 PM @ [facility], SPOKE WITH PATIENT AND HE AGREED TO COME

## 2023-11-03 NOTE — Progress Notes (Signed)
Patient confirmed he would like to proceed with 7.5 weeks of salvage radiation.    Plan of care in process.

## 2023-11-07 ENCOUNTER — Telehealth: Payer: Self-pay | Admitting: *Deleted

## 2023-11-07 NOTE — Telephone Encounter (Signed)
CALLED PATIENT TO REMIND OF SIM APPT. FOR 11-10-23- ARRIVAL TIME- 1:45 PM @ [facility], SPOKE WITH PATIENT AND HE IS AWARE OF THIS APPT.

## 2023-11-10 ENCOUNTER — Ambulatory Visit
Admission: RE | Admit: 2023-11-10 | Discharge: 2023-11-10 | Disposition: A | Discharge status: Home / Self Care | Payer: BC Managed Care – PPO | Source: Ambulatory Visit | Attending: Radiation Oncology | Admitting: Radiation Oncology

## 2023-11-10 DIAGNOSIS — C61 Malignant neoplasm of prostate: Secondary | ICD-10-CM | POA: Insufficient documentation

## 2023-11-10 DIAGNOSIS — Z510 Encounter for antineoplastic radiation therapy: Secondary | ICD-10-CM | POA: Diagnosis not present

## 2023-11-10 DIAGNOSIS — Z191 Hormone sensitive malignancy status: Secondary | ICD-10-CM | POA: Diagnosis not present

## 2023-11-10 NOTE — Progress Notes (Signed)
  Radiation Oncology         [phone] ________________________________  Name: Ian White MRN: [id number]  Date: 11/10/2023  DOB: 04/05/1956  SIMULATION AND TREATMENT PLANNING NOTE    ICD-10-CM   1. Local recurrence of malignant neoplasm of prostate [facility])  C61       DIAGNOSIS:  67 y.o. gentleman with a rising, detectable postoperative PSA of 4.32 s/p RALP 12/2014 for Stage pT2cNX, Gleason 3+4 prostate cancer  NARRATIVE:  The patient was brought to the CT Simulation planning suite.  Identity was confirmed.  All relevant records and images related to the planned course of therapy were reviewed.  The patient freely provided informed written consent to proceed with treatment after reviewing the details related to the planned course of therapy. The consent form was witnessed and verified by the simulation staff.  Then, the patient was set-up in a stable reproducible supine position for radiation therapy.  A vacuum lock pillow device was custom fabricated to position his legs in a reproducible immobilized position.  Then, I performed a urethrogram under sterile conditions to identify the prostatic bed.  CT images were obtained.  Surface markings were placed.  The CT images were loaded into the planning software.  Then the prostate bed target, pelvic lymph node target and avoidance structures including the rectum, bladder, bowel and hips were contoured.  Treatment planning then occurred.  The radiation prescription was entered and confirmed.  A total of one complex treatment devices were fabricated. I have requested : Intensity Modulated Radiotherapy (IMRT) is medically necessary for this case for the following reason:  Rectal sparing.Marland Kitchen  PLAN:  The patient will receive 45 Gy in 25 fractions of 1.8 Gy, followed by a boost to the prostate bed to a total dose of 68.4 Gy with 13 additional fractions of 1.8 Gy.   ________________________________  Artist Pais Kathrynn Running, M.D.

## 2023-11-11 DIAGNOSIS — Z510 Encounter for antineoplastic radiation therapy: Secondary | ICD-10-CM | POA: Diagnosis not present

## 2023-11-11 DIAGNOSIS — Z191 Hormone sensitive malignancy status: Secondary | ICD-10-CM | POA: Diagnosis not present

## 2023-11-11 DIAGNOSIS — C61 Malignant neoplasm of prostate: Secondary | ICD-10-CM | POA: Diagnosis not present

## 2023-11-19 ENCOUNTER — Other Ambulatory Visit: Payer: Self-pay

## 2023-11-19 ENCOUNTER — Ambulatory Visit
Admission: RE | Admit: 2023-11-19 | Discharge: 2023-11-19 | Disposition: A | Discharge status: Home / Self Care | Payer: BC Managed Care – PPO | Source: Ambulatory Visit | Attending: Radiation Oncology

## 2023-11-19 DIAGNOSIS — C61 Malignant neoplasm of prostate: Secondary | ICD-10-CM | POA: Diagnosis not present

## 2023-11-19 DIAGNOSIS — Z191 Hormone sensitive malignancy status: Secondary | ICD-10-CM | POA: Diagnosis not present

## 2023-11-19 DIAGNOSIS — Z510 Encounter for antineoplastic radiation therapy: Secondary | ICD-10-CM | POA: Diagnosis not present

## 2023-11-19 LAB — RAD ONC ARIA SESSION SUMMARY
Course Elapsed Days: 0
Plan Fractions Treated to Date: 1
Plan Prescribed Dose Per Fraction: 1.8 Gy
Plan Total Fractions Prescribed: 25
Plan Total Prescribed Dose: 45 Gy
Reference Point Dosage Given to Date: 1.8 Gy
Reference Point Session Dosage Given: 1.8 Gy
Session Number: 1

## 2023-11-20 ENCOUNTER — Ambulatory Visit
Admission: RE | Admit: 2023-11-20 | Discharge: 2023-11-20 | Disposition: A | Discharge status: Home / Self Care | Payer: BC Managed Care – PPO | Source: Ambulatory Visit | Attending: Radiation Oncology | Admitting: Radiation Oncology

## 2023-11-20 ENCOUNTER — Other Ambulatory Visit: Payer: Self-pay

## 2023-11-20 DIAGNOSIS — Z510 Encounter for antineoplastic radiation therapy: Secondary | ICD-10-CM | POA: Diagnosis not present

## 2023-11-20 DIAGNOSIS — C61 Malignant neoplasm of prostate: Secondary | ICD-10-CM | POA: Diagnosis not present

## 2023-11-20 DIAGNOSIS — Z191 Hormone sensitive malignancy status: Secondary | ICD-10-CM | POA: Diagnosis not present

## 2023-11-20 LAB — RAD ONC ARIA SESSION SUMMARY
Course Elapsed Days: 1
Plan Fractions Treated to Date: 2
Plan Prescribed Dose Per Fraction: 1.8 Gy
Plan Total Fractions Prescribed: 25
Plan Total Prescribed Dose: 45 Gy
Reference Point Dosage Given to Date: 3.6 Gy
Reference Point Session Dosage Given: 1.8 Gy
Session Number: 2

## 2023-11-21 ENCOUNTER — Other Ambulatory Visit: Payer: Self-pay

## 2023-11-21 ENCOUNTER — Ambulatory Visit
Admission: RE | Admit: 2023-11-21 | Discharge: 2023-11-21 | Disposition: A | Discharge status: Home / Self Care | Payer: BC Managed Care – PPO | Source: Ambulatory Visit | Attending: Radiation Oncology

## 2023-11-21 DIAGNOSIS — C61 Malignant neoplasm of prostate: Secondary | ICD-10-CM | POA: Diagnosis not present

## 2023-11-21 DIAGNOSIS — Z191 Hormone sensitive malignancy status: Secondary | ICD-10-CM | POA: Diagnosis not present

## 2023-11-21 DIAGNOSIS — Z510 Encounter for antineoplastic radiation therapy: Secondary | ICD-10-CM | POA: Diagnosis not present

## 2023-11-21 LAB — RAD ONC ARIA SESSION SUMMARY
Course Elapsed Days: 2
Plan Fractions Treated to Date: 3
Plan Prescribed Dose Per Fraction: 1.8 Gy
Plan Total Fractions Prescribed: 25
Plan Total Prescribed Dose: 45 Gy
Reference Point Dosage Given to Date: 5.4 Gy
Reference Point Session Dosage Given: 1.8 Gy
Session Number: 3

## 2023-11-23 ENCOUNTER — Ambulatory Visit
Admission: RE | Admit: 2023-11-23 | Discharge: 2023-11-23 | Disposition: A | Discharge status: Home / Self Care | Payer: BC Managed Care – PPO | Source: Ambulatory Visit | Attending: Radiation Oncology | Admitting: Radiation Oncology

## 2023-11-23 ENCOUNTER — Other Ambulatory Visit: Payer: Self-pay

## 2023-11-23 DIAGNOSIS — Z191 Hormone sensitive malignancy status: Secondary | ICD-10-CM | POA: Diagnosis not present

## 2023-11-23 DIAGNOSIS — Z510 Encounter for antineoplastic radiation therapy: Secondary | ICD-10-CM | POA: Diagnosis not present

## 2023-11-23 DIAGNOSIS — C61 Malignant neoplasm of prostate: Secondary | ICD-10-CM | POA: Diagnosis not present

## 2023-11-23 LAB — RAD ONC ARIA SESSION SUMMARY
Course Elapsed Days: 4
Plan Fractions Treated to Date: 4
Plan Prescribed Dose Per Fraction: 1.8 Gy
Plan Total Fractions Prescribed: 25
Plan Total Prescribed Dose: 45 Gy
Reference Point Dosage Given to Date: 7.2 Gy
Reference Point Session Dosage Given: 1.8 Gy
Session Number: 4

## 2023-11-24 ENCOUNTER — Other Ambulatory Visit: Payer: Self-pay

## 2023-11-24 ENCOUNTER — Ambulatory Visit
Admission: RE | Admit: 2023-11-24 | Discharge: 2023-11-24 | Disposition: A | Discharge status: Home / Self Care | Payer: BC Managed Care – PPO | Source: Ambulatory Visit | Attending: Radiation Oncology

## 2023-11-24 DIAGNOSIS — Z191 Hormone sensitive malignancy status: Secondary | ICD-10-CM | POA: Diagnosis not present

## 2023-11-24 DIAGNOSIS — Z510 Encounter for antineoplastic radiation therapy: Secondary | ICD-10-CM | POA: Diagnosis not present

## 2023-11-24 DIAGNOSIS — C61 Malignant neoplasm of prostate: Secondary | ICD-10-CM | POA: Diagnosis not present

## 2023-11-24 LAB — RAD ONC ARIA SESSION SUMMARY
Course Elapsed Days: 5
Plan Fractions Treated to Date: 5
Plan Prescribed Dose Per Fraction: 1.8 Gy
Plan Total Fractions Prescribed: 25
Plan Total Prescribed Dose: 45 Gy
Reference Point Dosage Given to Date: 9 Gy
Reference Point Session Dosage Given: 1.8 Gy
Session Number: 5

## 2023-11-25 ENCOUNTER — Ambulatory Visit
Admission: RE | Admit: 2023-11-25 | Discharge: 2023-11-25 | Disposition: A | Discharge status: Home / Self Care | Payer: BC Managed Care – PPO | Source: Ambulatory Visit | Attending: Radiation Oncology | Admitting: Radiation Oncology

## 2023-11-25 ENCOUNTER — Other Ambulatory Visit: Payer: Self-pay

## 2023-11-25 DIAGNOSIS — C61 Malignant neoplasm of prostate: Secondary | ICD-10-CM | POA: Diagnosis not present

## 2023-11-25 DIAGNOSIS — Z191 Hormone sensitive malignancy status: Secondary | ICD-10-CM | POA: Diagnosis not present

## 2023-11-25 DIAGNOSIS — Z510 Encounter for antineoplastic radiation therapy: Secondary | ICD-10-CM | POA: Diagnosis not present

## 2023-11-25 LAB — RAD ONC ARIA SESSION SUMMARY
Course Elapsed Days: 6
Plan Fractions Treated to Date: 6
Plan Prescribed Dose Per Fraction: 1.8 Gy
Plan Total Fractions Prescribed: 25
Plan Total Prescribed Dose: 45 Gy
Reference Point Dosage Given to Date: 10.8 Gy
Reference Point Session Dosage Given: 1.8 Gy
Session Number: 6

## 2023-11-26 ENCOUNTER — Ambulatory Visit
Admission: RE | Admit: 2023-11-26 | Discharge: 2023-11-26 | Disposition: A | Discharge status: Home / Self Care | Payer: BC Managed Care – PPO | Source: Ambulatory Visit | Attending: Radiation Oncology | Admitting: Radiation Oncology

## 2023-11-26 ENCOUNTER — Other Ambulatory Visit: Payer: Self-pay

## 2023-11-26 DIAGNOSIS — Z510 Encounter for antineoplastic radiation therapy: Secondary | ICD-10-CM | POA: Diagnosis not present

## 2023-11-26 DIAGNOSIS — C61 Malignant neoplasm of prostate: Secondary | ICD-10-CM | POA: Diagnosis not present

## 2023-11-26 DIAGNOSIS — Z191 Hormone sensitive malignancy status: Secondary | ICD-10-CM | POA: Diagnosis not present

## 2023-11-26 LAB — RAD ONC ARIA SESSION SUMMARY
Course Elapsed Days: 7
Plan Fractions Treated to Date: 7
Plan Prescribed Dose Per Fraction: 1.8 Gy
Plan Total Fractions Prescribed: 25
Plan Total Prescribed Dose: 45 Gy
Reference Point Dosage Given to Date: 12.6 Gy
Reference Point Session Dosage Given: 1.8 Gy
Session Number: 7

## 2023-11-30 ENCOUNTER — Ambulatory Visit: Payer: BC Managed Care – PPO

## 2023-12-01 ENCOUNTER — Ambulatory Visit: Payer: BC Managed Care – PPO

## 2023-12-01 ENCOUNTER — Ambulatory Visit
Admission: RE | Admit: 2023-12-01 | Discharge: 2023-12-01 | Disposition: A | Discharge status: Home / Self Care | Payer: BC Managed Care – PPO | Source: Ambulatory Visit | Attending: Radiation Oncology | Admitting: Radiation Oncology

## 2023-12-01 ENCOUNTER — Other Ambulatory Visit: Payer: Self-pay

## 2023-12-01 DIAGNOSIS — C61 Malignant neoplasm of prostate: Secondary | ICD-10-CM | POA: Insufficient documentation

## 2023-12-01 DIAGNOSIS — Z510 Encounter for antineoplastic radiation therapy: Secondary | ICD-10-CM | POA: Insufficient documentation

## 2023-12-01 DIAGNOSIS — Z191 Hormone sensitive malignancy status: Secondary | ICD-10-CM | POA: Diagnosis not present

## 2023-12-01 LAB — RAD ONC ARIA SESSION SUMMARY
Course Elapsed Days: 12
Plan Fractions Treated to Date: 8
Plan Prescribed Dose Per Fraction: 1.8 Gy
Plan Total Fractions Prescribed: 25
Plan Total Prescribed Dose: 45 Gy
Reference Point Dosage Given to Date: 14.4 Gy
Reference Point Session Dosage Given: 1.8 Gy
Session Number: 8

## 2023-12-02 ENCOUNTER — Ambulatory Visit
Admission: RE | Admit: 2023-12-02 | Discharge: 2023-12-02 | Disposition: A | Discharge status: Home / Self Care | Payer: BC Managed Care – PPO | Source: Ambulatory Visit | Attending: Radiation Oncology | Admitting: Radiation Oncology

## 2023-12-02 ENCOUNTER — Other Ambulatory Visit: Payer: Self-pay

## 2023-12-02 DIAGNOSIS — Z510 Encounter for antineoplastic radiation therapy: Secondary | ICD-10-CM | POA: Diagnosis not present

## 2023-12-02 DIAGNOSIS — Z191 Hormone sensitive malignancy status: Secondary | ICD-10-CM | POA: Diagnosis not present

## 2023-12-02 DIAGNOSIS — C61 Malignant neoplasm of prostate: Secondary | ICD-10-CM | POA: Diagnosis not present

## 2023-12-02 LAB — RAD ONC ARIA SESSION SUMMARY
Course Elapsed Days: 13
Plan Fractions Treated to Date: 9
Plan Prescribed Dose Per Fraction: 1.8 Gy
Plan Total Fractions Prescribed: 25
Plan Total Prescribed Dose: 45 Gy
Reference Point Dosage Given to Date: 16.2 Gy
Reference Point Session Dosage Given: 1.8 Gy
Session Number: 9

## 2023-12-03 ENCOUNTER — Ambulatory Visit
Admission: RE | Admit: 2023-12-03 | Discharge: 2023-12-03 | Disposition: A | Discharge status: Home / Self Care | Payer: BC Managed Care – PPO | Source: Ambulatory Visit | Attending: Radiation Oncology | Admitting: Radiation Oncology

## 2023-12-03 ENCOUNTER — Other Ambulatory Visit: Payer: Self-pay

## 2023-12-03 DIAGNOSIS — C61 Malignant neoplasm of prostate: Secondary | ICD-10-CM | POA: Diagnosis not present

## 2023-12-03 DIAGNOSIS — Z510 Encounter for antineoplastic radiation therapy: Secondary | ICD-10-CM | POA: Diagnosis not present

## 2023-12-03 DIAGNOSIS — Z191 Hormone sensitive malignancy status: Secondary | ICD-10-CM | POA: Diagnosis not present

## 2023-12-03 LAB — RAD ONC ARIA SESSION SUMMARY
Course Elapsed Days: 14
Plan Fractions Treated to Date: 10
Plan Prescribed Dose Per Fraction: 1.8 Gy
Plan Total Fractions Prescribed: 25
Plan Total Prescribed Dose: 45 Gy
Reference Point Dosage Given to Date: 18 Gy
Reference Point Session Dosage Given: 1.8 Gy
Session Number: 10

## 2023-12-04 ENCOUNTER — Other Ambulatory Visit: Payer: Self-pay

## 2023-12-04 ENCOUNTER — Ambulatory Visit
Admission: RE | Admit: 2023-12-04 | Discharge: 2023-12-04 | Disposition: A | Discharge status: Home / Self Care | Payer: BC Managed Care – PPO | Source: Ambulatory Visit | Attending: Radiation Oncology | Admitting: Radiation Oncology

## 2023-12-04 DIAGNOSIS — Z510 Encounter for antineoplastic radiation therapy: Secondary | ICD-10-CM | POA: Diagnosis not present

## 2023-12-04 DIAGNOSIS — Z191 Hormone sensitive malignancy status: Secondary | ICD-10-CM | POA: Diagnosis not present

## 2023-12-04 DIAGNOSIS — C61 Malignant neoplasm of prostate: Secondary | ICD-10-CM | POA: Diagnosis not present

## 2023-12-04 LAB — RAD ONC ARIA SESSION SUMMARY
Course Elapsed Days: 15
Plan Fractions Treated to Date: 11
Plan Prescribed Dose Per Fraction: 1.8 Gy
Plan Total Fractions Prescribed: 25
Plan Total Prescribed Dose: 45 Gy
Reference Point Dosage Given to Date: 19.8 Gy
Reference Point Session Dosage Given: 1.8 Gy
Session Number: 11

## 2023-12-05 ENCOUNTER — Other Ambulatory Visit: Payer: Self-pay

## 2023-12-05 ENCOUNTER — Encounter: Payer: Self-pay | Admitting: Cardiovascular Disease

## 2023-12-05 ENCOUNTER — Ambulatory Visit
Admission: RE | Admit: 2023-12-05 | Discharge: 2023-12-05 | Disposition: A | Discharge status: Home / Self Care | Payer: BC Managed Care – PPO | Source: Ambulatory Visit | Attending: Radiation Oncology

## 2023-12-05 DIAGNOSIS — Z510 Encounter for antineoplastic radiation therapy: Secondary | ICD-10-CM | POA: Diagnosis not present

## 2023-12-05 DIAGNOSIS — C61 Malignant neoplasm of prostate: Secondary | ICD-10-CM | POA: Diagnosis not present

## 2023-12-05 DIAGNOSIS — Z191 Hormone sensitive malignancy status: Secondary | ICD-10-CM | POA: Diagnosis not present

## 2023-12-05 LAB — RAD ONC ARIA SESSION SUMMARY
Course Elapsed Days: 16
Plan Fractions Treated to Date: 12
Plan Prescribed Dose Per Fraction: 1.8 Gy
Plan Total Fractions Prescribed: 25
Plan Total Prescribed Dose: 45 Gy
Reference Point Dosage Given to Date: 21.6 Gy
Reference Point Session Dosage Given: 1.8 Gy
Session Number: 12

## 2023-12-08 ENCOUNTER — Ambulatory Visit
Admission: RE | Admit: 2023-12-08 | Discharge: 2023-12-08 | Disposition: A | Discharge status: Home / Self Care | Payer: BC Managed Care – PPO | Source: Ambulatory Visit | Attending: Radiation Oncology | Admitting: Radiation Oncology

## 2023-12-08 ENCOUNTER — Other Ambulatory Visit: Payer: Self-pay

## 2023-12-08 DIAGNOSIS — Z510 Encounter for antineoplastic radiation therapy: Secondary | ICD-10-CM | POA: Diagnosis not present

## 2023-12-08 DIAGNOSIS — Z191 Hormone sensitive malignancy status: Secondary | ICD-10-CM | POA: Diagnosis not present

## 2023-12-08 DIAGNOSIS — C61 Malignant neoplasm of prostate: Secondary | ICD-10-CM | POA: Diagnosis not present

## 2023-12-08 LAB — RAD ONC ARIA SESSION SUMMARY
Course Elapsed Days: 19
Plan Fractions Treated to Date: 13
Plan Prescribed Dose Per Fraction: 1.8 Gy
Plan Total Fractions Prescribed: 25
Plan Total Prescribed Dose: 45 Gy
Reference Point Dosage Given to Date: 23.4 Gy
Reference Point Session Dosage Given: 1.8 Gy
Session Number: 13

## 2023-12-09 ENCOUNTER — Other Ambulatory Visit: Payer: Self-pay

## 2023-12-09 ENCOUNTER — Ambulatory Visit
Admission: RE | Admit: 2023-12-09 | Discharge: 2023-12-09 | Disposition: A | Discharge status: Home / Self Care | Payer: BC Managed Care – PPO | Source: Ambulatory Visit | Attending: Radiation Oncology

## 2023-12-09 DIAGNOSIS — Z510 Encounter for antineoplastic radiation therapy: Secondary | ICD-10-CM | POA: Diagnosis not present

## 2023-12-09 DIAGNOSIS — C61 Malignant neoplasm of prostate: Secondary | ICD-10-CM | POA: Diagnosis not present

## 2023-12-09 DIAGNOSIS — Z191 Hormone sensitive malignancy status: Secondary | ICD-10-CM | POA: Diagnosis not present

## 2023-12-09 LAB — RAD ONC ARIA SESSION SUMMARY
Course Elapsed Days: 20
Plan Fractions Treated to Date: 14
Plan Prescribed Dose Per Fraction: 1.8 Gy
Plan Total Fractions Prescribed: 25
Plan Total Prescribed Dose: 45 Gy
Reference Point Dosage Given to Date: 25.2 Gy
Reference Point Session Dosage Given: 1.8 Gy
Session Number: 14

## 2023-12-10 ENCOUNTER — Ambulatory Visit
Admission: RE | Admit: 2023-12-10 | Discharge: 2023-12-10 | Disposition: A | Discharge status: Home / Self Care | Payer: BC Managed Care – PPO | Source: Ambulatory Visit | Attending: Radiation Oncology | Admitting: Radiation Oncology

## 2023-12-10 ENCOUNTER — Other Ambulatory Visit: Payer: Self-pay

## 2023-12-10 DIAGNOSIS — Z191 Hormone sensitive malignancy status: Secondary | ICD-10-CM | POA: Diagnosis not present

## 2023-12-10 DIAGNOSIS — Z510 Encounter for antineoplastic radiation therapy: Secondary | ICD-10-CM | POA: Diagnosis not present

## 2023-12-10 DIAGNOSIS — C61 Malignant neoplasm of prostate: Secondary | ICD-10-CM | POA: Diagnosis not present

## 2023-12-10 LAB — RAD ONC ARIA SESSION SUMMARY
Course Elapsed Days: 21
Plan Fractions Treated to Date: 15
Plan Prescribed Dose Per Fraction: 1.8 Gy
Plan Total Fractions Prescribed: 25
Plan Total Prescribed Dose: 45 Gy
Reference Point Dosage Given to Date: 27 Gy
Reference Point Session Dosage Given: 1.8 Gy
Session Number: 15

## 2023-12-11 ENCOUNTER — Other Ambulatory Visit: Payer: Self-pay

## 2023-12-11 ENCOUNTER — Ambulatory Visit
Admission: RE | Admit: 2023-12-11 | Discharge: 2023-12-11 | Disposition: A | Discharge status: Home / Self Care | Payer: BC Managed Care – PPO | Source: Ambulatory Visit | Attending: Radiation Oncology | Admitting: Radiation Oncology

## 2023-12-11 DIAGNOSIS — C61 Malignant neoplasm of prostate: Secondary | ICD-10-CM | POA: Diagnosis not present

## 2023-12-11 DIAGNOSIS — Z191 Hormone sensitive malignancy status: Secondary | ICD-10-CM | POA: Diagnosis not present

## 2023-12-11 DIAGNOSIS — Z510 Encounter for antineoplastic radiation therapy: Secondary | ICD-10-CM | POA: Diagnosis not present

## 2023-12-11 LAB — RAD ONC ARIA SESSION SUMMARY
Course Elapsed Days: 22
Plan Fractions Treated to Date: 16
Plan Prescribed Dose Per Fraction: 1.8 Gy
Plan Total Fractions Prescribed: 25
Plan Total Prescribed Dose: 45 Gy
Reference Point Dosage Given to Date: 28.8 Gy
Reference Point Session Dosage Given: 1.8 Gy
Session Number: 16

## 2023-12-12 ENCOUNTER — Ambulatory Visit
Admission: RE | Admit: 2023-12-12 | Discharge: 2023-12-12 | Disposition: A | Discharge status: Home / Self Care | Payer: BC Managed Care – PPO | Source: Ambulatory Visit | Attending: Radiation Oncology

## 2023-12-12 ENCOUNTER — Other Ambulatory Visit: Payer: Self-pay

## 2023-12-12 ENCOUNTER — Ambulatory Visit
Admission: RE | Admit: 2023-12-12 | Discharge: 2023-12-12 | Disposition: A | Discharge status: Home / Self Care | Payer: BC Managed Care – PPO | Source: Ambulatory Visit | Attending: Radiation Oncology | Admitting: Radiation Oncology

## 2023-12-12 DIAGNOSIS — C61 Malignant neoplasm of prostate: Secondary | ICD-10-CM | POA: Diagnosis not present

## 2023-12-12 DIAGNOSIS — Z191 Hormone sensitive malignancy status: Secondary | ICD-10-CM | POA: Diagnosis not present

## 2023-12-12 DIAGNOSIS — Z510 Encounter for antineoplastic radiation therapy: Secondary | ICD-10-CM | POA: Diagnosis not present

## 2023-12-12 LAB — RAD ONC ARIA SESSION SUMMARY
Course Elapsed Days: 23
Plan Fractions Treated to Date: 17
Plan Prescribed Dose Per Fraction: 1.8 Gy
Plan Total Fractions Prescribed: 25
Plan Total Prescribed Dose: 45 Gy
Reference Point Dosage Given to Date: 30.6 Gy
Reference Point Session Dosage Given: 1.8 Gy
Session Number: 17

## 2023-12-15 ENCOUNTER — Ambulatory Visit
Admission: RE | Admit: 2023-12-15 | Discharge: 2023-12-15 | Disposition: A | Discharge status: Home / Self Care | Payer: BC Managed Care – PPO | Source: Ambulatory Visit | Attending: Radiation Oncology

## 2023-12-15 ENCOUNTER — Other Ambulatory Visit: Payer: Self-pay

## 2023-12-15 DIAGNOSIS — Z510 Encounter for antineoplastic radiation therapy: Secondary | ICD-10-CM | POA: Diagnosis not present

## 2023-12-15 DIAGNOSIS — C61 Malignant neoplasm of prostate: Secondary | ICD-10-CM | POA: Diagnosis not present

## 2023-12-15 DIAGNOSIS — Z191 Hormone sensitive malignancy status: Secondary | ICD-10-CM | POA: Diagnosis not present

## 2023-12-15 LAB — RAD ONC ARIA SESSION SUMMARY
Course Elapsed Days: 26
Plan Fractions Treated to Date: 18
Plan Prescribed Dose Per Fraction: 1.8 Gy
Plan Total Fractions Prescribed: 25
Plan Total Prescribed Dose: 45 Gy
Reference Point Dosage Given to Date: 32.4 Gy
Reference Point Session Dosage Given: 1.8 Gy
Session Number: 18

## 2023-12-16 ENCOUNTER — Other Ambulatory Visit: Payer: Self-pay

## 2023-12-16 ENCOUNTER — Ambulatory Visit
Admission: RE | Admit: 2023-12-16 | Discharge: 2023-12-16 | Disposition: A | Discharge status: Home / Self Care | Payer: BC Managed Care – PPO | Source: Ambulatory Visit | Attending: Radiation Oncology

## 2023-12-16 DIAGNOSIS — Z191 Hormone sensitive malignancy status: Secondary | ICD-10-CM | POA: Diagnosis not present

## 2023-12-16 DIAGNOSIS — C61 Malignant neoplasm of prostate: Secondary | ICD-10-CM | POA: Diagnosis not present

## 2023-12-16 DIAGNOSIS — Z510 Encounter for antineoplastic radiation therapy: Secondary | ICD-10-CM | POA: Diagnosis not present

## 2023-12-16 LAB — RAD ONC ARIA SESSION SUMMARY
Course Elapsed Days: 27
Plan Fractions Treated to Date: 19
Plan Prescribed Dose Per Fraction: 1.8 Gy
Plan Total Fractions Prescribed: 25
Plan Total Prescribed Dose: 45 Gy
Reference Point Dosage Given to Date: 34.2 Gy
Reference Point Session Dosage Given: 1.8 Gy
Session Number: 19

## 2023-12-17 ENCOUNTER — Other Ambulatory Visit: Payer: Self-pay

## 2023-12-17 ENCOUNTER — Ambulatory Visit
Admission: RE | Admit: 2023-12-17 | Discharge: 2023-12-17 | Disposition: A | Discharge status: Home / Self Care | Payer: BC Managed Care – PPO | Source: Ambulatory Visit | Attending: Radiation Oncology | Admitting: Radiation Oncology

## 2023-12-17 DIAGNOSIS — Z191 Hormone sensitive malignancy status: Secondary | ICD-10-CM | POA: Diagnosis not present

## 2023-12-17 DIAGNOSIS — Z510 Encounter for antineoplastic radiation therapy: Secondary | ICD-10-CM | POA: Diagnosis not present

## 2023-12-17 DIAGNOSIS — C61 Malignant neoplasm of prostate: Secondary | ICD-10-CM | POA: Diagnosis not present

## 2023-12-17 LAB — RAD ONC ARIA SESSION SUMMARY
Course Elapsed Days: 28
Plan Fractions Treated to Date: 20
Plan Prescribed Dose Per Fraction: 1.8 Gy
Plan Total Fractions Prescribed: 25
Plan Total Prescribed Dose: 45 Gy
Reference Point Dosage Given to Date: 36 Gy
Reference Point Session Dosage Given: 1.8 Gy
Session Number: 20

## 2023-12-18 ENCOUNTER — Ambulatory Visit
Admission: RE | Admit: 2023-12-18 | Discharge: 2023-12-18 | Disposition: A | Discharge status: Home / Self Care | Payer: BC Managed Care – PPO | Source: Ambulatory Visit | Attending: Radiation Oncology | Admitting: Radiation Oncology

## 2023-12-18 ENCOUNTER — Other Ambulatory Visit: Payer: Self-pay

## 2023-12-18 DIAGNOSIS — C61 Malignant neoplasm of prostate: Secondary | ICD-10-CM | POA: Diagnosis not present

## 2023-12-18 DIAGNOSIS — Z510 Encounter for antineoplastic radiation therapy: Secondary | ICD-10-CM | POA: Diagnosis not present

## 2023-12-18 DIAGNOSIS — Z191 Hormone sensitive malignancy status: Secondary | ICD-10-CM | POA: Diagnosis not present

## 2023-12-18 LAB — RAD ONC ARIA SESSION SUMMARY
Course Elapsed Days: 29
Plan Fractions Treated to Date: 21
Plan Prescribed Dose Per Fraction: 1.8 Gy
Plan Total Fractions Prescribed: 25
Plan Total Prescribed Dose: 45 Gy
Reference Point Dosage Given to Date: 37.8 Gy
Reference Point Session Dosage Given: 1.8 Gy
Session Number: 21

## 2023-12-19 ENCOUNTER — Ambulatory Visit
Admission: RE | Admit: 2023-12-19 | Discharge: 2023-12-19 | Disposition: A | Discharge status: Home / Self Care | Payer: BC Managed Care – PPO | Source: Ambulatory Visit | Attending: Radiation Oncology | Admitting: Radiation Oncology

## 2023-12-19 ENCOUNTER — Other Ambulatory Visit: Payer: Self-pay

## 2023-12-19 DIAGNOSIS — Z510 Encounter for antineoplastic radiation therapy: Secondary | ICD-10-CM | POA: Diagnosis not present

## 2023-12-19 DIAGNOSIS — C61 Malignant neoplasm of prostate: Secondary | ICD-10-CM | POA: Diagnosis not present

## 2023-12-19 DIAGNOSIS — Z191 Hormone sensitive malignancy status: Secondary | ICD-10-CM | POA: Diagnosis not present

## 2023-12-19 LAB — RAD ONC ARIA SESSION SUMMARY
Course Elapsed Days: 30
Plan Fractions Treated to Date: 22
Plan Prescribed Dose Per Fraction: 1.8 Gy
Plan Total Fractions Prescribed: 25
Plan Total Prescribed Dose: 45 Gy
Reference Point Dosage Given to Date: 39.6 Gy
Reference Point Session Dosage Given: 1.8 Gy
Session Number: 22

## 2023-12-21 ENCOUNTER — Ambulatory Visit: Payer: BC Managed Care – PPO

## 2023-12-22 ENCOUNTER — Ambulatory Visit
Admission: RE | Admit: 2023-12-22 | Discharge: 2023-12-22 | Disposition: A | Discharge status: Home / Self Care | Payer: BC Managed Care – PPO | Source: Ambulatory Visit | Attending: Radiation Oncology

## 2023-12-22 ENCOUNTER — Other Ambulatory Visit: Payer: Self-pay

## 2023-12-22 DIAGNOSIS — Z191 Hormone sensitive malignancy status: Secondary | ICD-10-CM | POA: Diagnosis not present

## 2023-12-22 DIAGNOSIS — Z510 Encounter for antineoplastic radiation therapy: Secondary | ICD-10-CM | POA: Diagnosis not present

## 2023-12-22 DIAGNOSIS — C61 Malignant neoplasm of prostate: Secondary | ICD-10-CM | POA: Diagnosis not present

## 2023-12-22 LAB — RAD ONC ARIA SESSION SUMMARY
Course Elapsed Days: 33
Plan Fractions Treated to Date: 23
Plan Prescribed Dose Per Fraction: 1.8 Gy
Plan Total Fractions Prescribed: 25
Plan Total Prescribed Dose: 45 Gy
Reference Point Dosage Given to Date: 41.4 Gy
Reference Point Session Dosage Given: 1.8 Gy
Session Number: 23

## 2023-12-23 ENCOUNTER — Other Ambulatory Visit: Payer: Self-pay

## 2023-12-23 ENCOUNTER — Ambulatory Visit
Admission: RE | Admit: 2023-12-23 | Discharge: 2023-12-23 | Disposition: A | Discharge status: Home / Self Care | Payer: BC Managed Care – PPO | Source: Ambulatory Visit | Attending: Radiation Oncology

## 2023-12-23 DIAGNOSIS — C61 Malignant neoplasm of prostate: Secondary | ICD-10-CM | POA: Diagnosis not present

## 2023-12-23 DIAGNOSIS — Z191 Hormone sensitive malignancy status: Secondary | ICD-10-CM | POA: Diagnosis not present

## 2023-12-23 DIAGNOSIS — Z510 Encounter for antineoplastic radiation therapy: Secondary | ICD-10-CM | POA: Diagnosis not present

## 2023-12-23 LAB — RAD ONC ARIA SESSION SUMMARY
Course Elapsed Days: 34
Plan Fractions Treated to Date: 24
Plan Prescribed Dose Per Fraction: 1.8 Gy
Plan Total Fractions Prescribed: 25
Plan Total Prescribed Dose: 45 Gy
Reference Point Dosage Given to Date: 43.2 Gy
Reference Point Session Dosage Given: 1.8 Gy
Session Number: 24

## 2023-12-25 ENCOUNTER — Ambulatory Visit
Admission: RE | Admit: 2023-12-25 | Discharge: 2023-12-25 | Disposition: A | Discharge status: Home / Self Care | Payer: BC Managed Care – PPO | Source: Ambulatory Visit | Attending: Radiation Oncology | Admitting: Radiation Oncology

## 2023-12-25 ENCOUNTER — Other Ambulatory Visit: Payer: Self-pay

## 2023-12-25 DIAGNOSIS — C61 Malignant neoplasm of prostate: Secondary | ICD-10-CM | POA: Diagnosis not present

## 2023-12-25 DIAGNOSIS — Z191 Hormone sensitive malignancy status: Secondary | ICD-10-CM | POA: Diagnosis not present

## 2023-12-25 DIAGNOSIS — Z510 Encounter for antineoplastic radiation therapy: Secondary | ICD-10-CM | POA: Diagnosis not present

## 2023-12-25 LAB — RAD ONC ARIA SESSION SUMMARY
Course Elapsed Days: 36
Plan Fractions Treated to Date: 25
Plan Prescribed Dose Per Fraction: 1.8 Gy
Plan Total Fractions Prescribed: 25
Plan Total Prescribed Dose: 45 Gy
Reference Point Dosage Given to Date: 45 Gy
Reference Point Session Dosage Given: 1.8 Gy
Session Number: 25

## 2023-12-26 ENCOUNTER — Other Ambulatory Visit: Payer: Self-pay

## 2023-12-26 ENCOUNTER — Ambulatory Visit
Admission: RE | Admit: 2023-12-26 | Discharge: 2023-12-26 | Disposition: A | Discharge status: Home / Self Care | Payer: BC Managed Care – PPO | Source: Ambulatory Visit | Attending: Radiation Oncology | Admitting: Radiation Oncology

## 2023-12-26 ENCOUNTER — Ambulatory Visit: Payer: BC Managed Care – PPO

## 2023-12-26 ENCOUNTER — Ambulatory Visit
Admission: RE | Admit: 2023-12-26 | Discharge: 2023-12-26 | Disposition: A | Discharge status: Home / Self Care | Payer: BC Managed Care – PPO | Source: Ambulatory Visit | Attending: Radiation Oncology

## 2023-12-26 DIAGNOSIS — Z510 Encounter for antineoplastic radiation therapy: Secondary | ICD-10-CM | POA: Diagnosis not present

## 2023-12-26 DIAGNOSIS — Z191 Hormone sensitive malignancy status: Secondary | ICD-10-CM | POA: Diagnosis not present

## 2023-12-26 DIAGNOSIS — C61 Malignant neoplasm of prostate: Secondary | ICD-10-CM | POA: Diagnosis not present

## 2023-12-26 LAB — RAD ONC ARIA SESSION SUMMARY
Course Elapsed Days: 37
Plan Fractions Treated to Date: 1
Plan Prescribed Dose Per Fraction: 2.5 Gy
Plan Total Fractions Prescribed: 13
Plan Total Prescribed Dose: 32.5 Gy
Reference Point Dosage Given to Date: 2.5 Gy
Reference Point Session Dosage Given: 2.5 Gy
Session Number: 26

## 2023-12-29 ENCOUNTER — Ambulatory Visit
Admission: RE | Admit: 2023-12-29 | Discharge: 2023-12-29 | Disposition: A | Discharge status: Home / Self Care | Payer: BC Managed Care – PPO | Source: Ambulatory Visit | Attending: Radiation Oncology | Admitting: Radiation Oncology

## 2023-12-29 ENCOUNTER — Other Ambulatory Visit: Payer: Self-pay

## 2023-12-29 ENCOUNTER — Ambulatory Visit: Payer: BC Managed Care – PPO

## 2023-12-29 DIAGNOSIS — Z191 Hormone sensitive malignancy status: Secondary | ICD-10-CM | POA: Diagnosis not present

## 2023-12-29 DIAGNOSIS — C61 Malignant neoplasm of prostate: Secondary | ICD-10-CM | POA: Diagnosis not present

## 2023-12-29 DIAGNOSIS — Z510 Encounter for antineoplastic radiation therapy: Secondary | ICD-10-CM | POA: Diagnosis not present

## 2023-12-29 LAB — RAD ONC ARIA SESSION SUMMARY
Course Elapsed Days: 40
Plan Fractions Treated to Date: 2
Plan Prescribed Dose Per Fraction: 2.5 Gy
Plan Total Fractions Prescribed: 13
Plan Total Prescribed Dose: 32.5 Gy
Reference Point Dosage Given to Date: 5 Gy
Reference Point Session Dosage Given: 2.5 Gy
Session Number: 27

## 2023-12-30 ENCOUNTER — Ambulatory Visit: Payer: BC Managed Care – PPO

## 2024-01-01 ENCOUNTER — Ambulatory Visit
Admission: RE | Admit: 2024-01-01 | Discharge: 2024-01-01 | Disposition: A | Discharge status: Home / Self Care | Payer: BC Managed Care – PPO | Source: Ambulatory Visit | Attending: Radiation Oncology | Admitting: Radiation Oncology

## 2024-01-01 ENCOUNTER — Other Ambulatory Visit: Payer: Self-pay

## 2024-01-01 DIAGNOSIS — C61 Malignant neoplasm of prostate: Secondary | ICD-10-CM | POA: Diagnosis not present

## 2024-01-01 DIAGNOSIS — Z191 Hormone sensitive malignancy status: Secondary | ICD-10-CM | POA: Diagnosis not present

## 2024-01-01 DIAGNOSIS — Z510 Encounter for antineoplastic radiation therapy: Secondary | ICD-10-CM | POA: Insufficient documentation

## 2024-01-01 LAB — RAD ONC ARIA SESSION SUMMARY
Course Elapsed Days: 43
Plan Fractions Treated to Date: 3
Plan Prescribed Dose Per Fraction: 2.5 Gy
Plan Total Fractions Prescribed: 13
Plan Total Prescribed Dose: 32.5 Gy
Reference Point Dosage Given to Date: 7.5 Gy
Reference Point Session Dosage Given: 2.5 Gy
Session Number: 28

## 2024-01-02 ENCOUNTER — Other Ambulatory Visit: Payer: Self-pay

## 2024-01-02 ENCOUNTER — Ambulatory Visit
Admission: RE | Admit: 2024-01-02 | Discharge: 2024-01-02 | Disposition: A | Discharge status: Home / Self Care | Payer: BC Managed Care – PPO | Source: Ambulatory Visit | Attending: Radiation Oncology

## 2024-01-02 DIAGNOSIS — Z510 Encounter for antineoplastic radiation therapy: Secondary | ICD-10-CM | POA: Diagnosis not present

## 2024-01-02 DIAGNOSIS — C61 Malignant neoplasm of prostate: Secondary | ICD-10-CM | POA: Diagnosis not present

## 2024-01-02 DIAGNOSIS — Z191 Hormone sensitive malignancy status: Secondary | ICD-10-CM | POA: Diagnosis not present

## 2024-01-02 LAB — RAD ONC ARIA SESSION SUMMARY
Course Elapsed Days: 44
Plan Fractions Treated to Date: 4
Plan Prescribed Dose Per Fraction: 2.5 Gy
Plan Total Fractions Prescribed: 13
Plan Total Prescribed Dose: 32.5 Gy
Reference Point Dosage Given to Date: 10 Gy
Reference Point Session Dosage Given: 2.5 Gy
Session Number: 29

## 2024-01-05 ENCOUNTER — Ambulatory Visit
Admission: RE | Admit: 2024-01-05 | Discharge: 2024-01-05 | Disposition: A | Discharge status: Home / Self Care | Payer: BC Managed Care – PPO | Source: Ambulatory Visit | Attending: Radiation Oncology | Admitting: Radiation Oncology

## 2024-01-05 ENCOUNTER — Other Ambulatory Visit: Payer: Self-pay

## 2024-01-05 DIAGNOSIS — C61 Malignant neoplasm of prostate: Secondary | ICD-10-CM | POA: Diagnosis not present

## 2024-01-05 DIAGNOSIS — Z191 Hormone sensitive malignancy status: Secondary | ICD-10-CM | POA: Diagnosis not present

## 2024-01-05 DIAGNOSIS — Z510 Encounter for antineoplastic radiation therapy: Secondary | ICD-10-CM | POA: Diagnosis not present

## 2024-01-05 LAB — RAD ONC ARIA SESSION SUMMARY
Course Elapsed Days: 47
Plan Fractions Treated to Date: 5
Plan Prescribed Dose Per Fraction: 2.5 Gy
Plan Total Fractions Prescribed: 13
Plan Total Prescribed Dose: 32.5 Gy
Reference Point Dosage Given to Date: 12.5 Gy
Reference Point Session Dosage Given: 2.5 Gy
Session Number: 30

## 2024-01-06 ENCOUNTER — Other Ambulatory Visit: Payer: Self-pay

## 2024-01-06 ENCOUNTER — Ambulatory Visit
Admission: RE | Admit: 2024-01-06 | Discharge: 2024-01-06 | Disposition: A | Discharge status: Home / Self Care | Payer: BC Managed Care – PPO | Source: Ambulatory Visit | Attending: Radiation Oncology

## 2024-01-06 DIAGNOSIS — C61 Malignant neoplasm of prostate: Secondary | ICD-10-CM | POA: Diagnosis not present

## 2024-01-06 DIAGNOSIS — Z191 Hormone sensitive malignancy status: Secondary | ICD-10-CM | POA: Diagnosis not present

## 2024-01-06 DIAGNOSIS — Z510 Encounter for antineoplastic radiation therapy: Secondary | ICD-10-CM | POA: Diagnosis not present

## 2024-01-06 LAB — RAD ONC ARIA SESSION SUMMARY
Course Elapsed Days: 48
Plan Fractions Treated to Date: 6
Plan Prescribed Dose Per Fraction: 2.5 Gy
Plan Total Fractions Prescribed: 13
Plan Total Prescribed Dose: 32.5 Gy
Reference Point Dosage Given to Date: 15 Gy
Reference Point Session Dosage Given: 2.5 Gy
Session Number: 31

## 2024-01-07 ENCOUNTER — Ambulatory Visit
Admission: RE | Admit: 2024-01-07 | Discharge: 2024-01-07 | Disposition: A | Discharge status: Home / Self Care | Payer: BC Managed Care – PPO | Source: Ambulatory Visit | Attending: Radiation Oncology | Admitting: Radiation Oncology

## 2024-01-07 ENCOUNTER — Other Ambulatory Visit: Payer: Self-pay

## 2024-01-07 DIAGNOSIS — Z191 Hormone sensitive malignancy status: Secondary | ICD-10-CM | POA: Diagnosis not present

## 2024-01-07 DIAGNOSIS — Z510 Encounter for antineoplastic radiation therapy: Secondary | ICD-10-CM | POA: Diagnosis not present

## 2024-01-07 DIAGNOSIS — C61 Malignant neoplasm of prostate: Secondary | ICD-10-CM | POA: Diagnosis not present

## 2024-01-07 LAB — RAD ONC ARIA SESSION SUMMARY
Course Elapsed Days: 49
Plan Fractions Treated to Date: 7
Plan Prescribed Dose Per Fraction: 2.5 Gy
Plan Total Fractions Prescribed: 13
Plan Total Prescribed Dose: 32.5 Gy
Reference Point Dosage Given to Date: 17.5 Gy
Reference Point Session Dosage Given: 2.5 Gy
Session Number: 32

## 2024-01-08 ENCOUNTER — Ambulatory Visit
Admission: RE | Admit: 2024-01-08 | Discharge: 2024-01-08 | Disposition: A | Discharge status: Home / Self Care | Payer: BC Managed Care – PPO | Source: Ambulatory Visit | Attending: Radiation Oncology | Admitting: Radiation Oncology

## 2024-01-08 ENCOUNTER — Other Ambulatory Visit: Payer: Self-pay

## 2024-01-08 DIAGNOSIS — L821 Other seborrheic keratosis: Secondary | ICD-10-CM | POA: Diagnosis not present

## 2024-01-08 DIAGNOSIS — C61 Malignant neoplasm of prostate: Secondary | ICD-10-CM | POA: Diagnosis not present

## 2024-01-08 DIAGNOSIS — Z510 Encounter for antineoplastic radiation therapy: Secondary | ICD-10-CM | POA: Diagnosis not present

## 2024-01-08 DIAGNOSIS — L814 Other melanin hyperpigmentation: Secondary | ICD-10-CM | POA: Diagnosis not present

## 2024-01-08 DIAGNOSIS — L570 Actinic keratosis: Secondary | ICD-10-CM | POA: Diagnosis not present

## 2024-01-08 DIAGNOSIS — Z191 Hormone sensitive malignancy status: Secondary | ICD-10-CM | POA: Diagnosis not present

## 2024-01-08 DIAGNOSIS — D229 Melanocytic nevi, unspecified: Secondary | ICD-10-CM | POA: Diagnosis not present

## 2024-01-08 LAB — RAD ONC ARIA SESSION SUMMARY
Course Elapsed Days: 50
Plan Fractions Treated to Date: 8
Plan Prescribed Dose Per Fraction: 2.5 Gy
Plan Total Fractions Prescribed: 13
Plan Total Prescribed Dose: 32.5 Gy
Reference Point Dosage Given to Date: 20 Gy
Reference Point Session Dosage Given: 2.5 Gy
Session Number: 33

## 2024-01-09 ENCOUNTER — Ambulatory Visit: Payer: BC Managed Care – PPO

## 2024-01-09 ENCOUNTER — Ambulatory Visit
Admission: RE | Admit: 2024-01-09 | Discharge: 2024-01-09 | Disposition: A | Discharge status: Home / Self Care | Payer: BC Managed Care – PPO | Source: Ambulatory Visit | Attending: Radiation Oncology

## 2024-01-09 ENCOUNTER — Other Ambulatory Visit: Payer: Self-pay

## 2024-01-09 DIAGNOSIS — C61 Malignant neoplasm of prostate: Secondary | ICD-10-CM | POA: Diagnosis not present

## 2024-01-09 DIAGNOSIS — Z191 Hormone sensitive malignancy status: Secondary | ICD-10-CM | POA: Diagnosis not present

## 2024-01-09 DIAGNOSIS — Z510 Encounter for antineoplastic radiation therapy: Secondary | ICD-10-CM | POA: Diagnosis not present

## 2024-01-09 LAB — RAD ONC ARIA SESSION SUMMARY
Course Elapsed Days: 51
Plan Fractions Treated to Date: 9
Plan Prescribed Dose Per Fraction: 2.5 Gy
Plan Total Fractions Prescribed: 13
Plan Total Prescribed Dose: 32.5 Gy
Reference Point Dosage Given to Date: 22.5 Gy
Reference Point Session Dosage Given: 2.5 Gy
Session Number: 34

## 2024-01-12 ENCOUNTER — Other Ambulatory Visit: Payer: Self-pay

## 2024-01-12 ENCOUNTER — Ambulatory Visit
Admission: RE | Admit: 2024-01-12 | Discharge: 2024-01-12 | Disposition: A | Discharge status: Home / Self Care | Payer: BC Managed Care – PPO | Source: Ambulatory Visit | Attending: Radiation Oncology

## 2024-01-12 DIAGNOSIS — Z510 Encounter for antineoplastic radiation therapy: Secondary | ICD-10-CM | POA: Diagnosis not present

## 2024-01-12 DIAGNOSIS — C61 Malignant neoplasm of prostate: Secondary | ICD-10-CM | POA: Diagnosis not present

## 2024-01-12 DIAGNOSIS — Z191 Hormone sensitive malignancy status: Secondary | ICD-10-CM | POA: Diagnosis not present

## 2024-01-12 LAB — RAD ONC ARIA SESSION SUMMARY
Course Elapsed Days: 54
Plan Fractions Treated to Date: 10
Plan Prescribed Dose Per Fraction: 2.5 Gy
Plan Total Fractions Prescribed: 13
Plan Total Prescribed Dose: 32.5 Gy
Reference Point Dosage Given to Date: 25 Gy
Reference Point Session Dosage Given: 2.5 Gy
Session Number: 35

## 2024-01-13 ENCOUNTER — Other Ambulatory Visit: Payer: Self-pay

## 2024-01-13 ENCOUNTER — Ambulatory Visit
Admission: RE | Admit: 2024-01-13 | Discharge: 2024-01-13 | Disposition: A | Discharge status: Home / Self Care | Payer: BC Managed Care – PPO | Source: Ambulatory Visit | Attending: Radiation Oncology

## 2024-01-13 DIAGNOSIS — C61 Malignant neoplasm of prostate: Secondary | ICD-10-CM | POA: Diagnosis not present

## 2024-01-13 DIAGNOSIS — Z191 Hormone sensitive malignancy status: Secondary | ICD-10-CM | POA: Diagnosis not present

## 2024-01-13 DIAGNOSIS — Z510 Encounter for antineoplastic radiation therapy: Secondary | ICD-10-CM | POA: Diagnosis not present

## 2024-01-13 LAB — RAD ONC ARIA SESSION SUMMARY
Course Elapsed Days: 55
Plan Fractions Treated to Date: 11
Plan Prescribed Dose Per Fraction: 2.5 Gy
Plan Total Fractions Prescribed: 13
Plan Total Prescribed Dose: 32.5 Gy
Reference Point Dosage Given to Date: 27.5 Gy
Reference Point Session Dosage Given: 2.5 Gy
Session Number: 36

## 2024-01-14 ENCOUNTER — Ambulatory Visit: Payer: BC Managed Care – PPO

## 2024-01-14 ENCOUNTER — Ambulatory Visit
Admission: RE | Admit: 2024-01-14 | Discharge: 2024-01-14 | Disposition: A | Discharge status: Home / Self Care | Payer: BC Managed Care – PPO | Source: Ambulatory Visit | Attending: Radiation Oncology

## 2024-01-14 ENCOUNTER — Other Ambulatory Visit: Payer: Self-pay

## 2024-01-14 DIAGNOSIS — C61 Malignant neoplasm of prostate: Secondary | ICD-10-CM | POA: Diagnosis not present

## 2024-01-14 DIAGNOSIS — Z510 Encounter for antineoplastic radiation therapy: Secondary | ICD-10-CM | POA: Diagnosis not present

## 2024-01-14 DIAGNOSIS — Z191 Hormone sensitive malignancy status: Secondary | ICD-10-CM | POA: Diagnosis not present

## 2024-01-14 LAB — RAD ONC ARIA SESSION SUMMARY
Course Elapsed Days: 56
Plan Fractions Treated to Date: 12
Plan Prescribed Dose Per Fraction: 2.5 Gy
Plan Total Fractions Prescribed: 13
Plan Total Prescribed Dose: 32.5 Gy
Reference Point Dosage Given to Date: 30 Gy
Reference Point Session Dosage Given: 2.5 Gy
Session Number: 37

## 2024-01-15 ENCOUNTER — Other Ambulatory Visit: Payer: Self-pay

## 2024-01-15 ENCOUNTER — Ambulatory Visit: Payer: BC Managed Care – PPO

## 2024-01-15 ENCOUNTER — Ambulatory Visit
Admission: RE | Admit: 2024-01-15 | Discharge: 2024-01-15 | Disposition: A | Discharge status: Home / Self Care | Payer: BC Managed Care – PPO | Source: Ambulatory Visit | Attending: Radiation Oncology | Admitting: Radiation Oncology

## 2024-01-15 DIAGNOSIS — Z510 Encounter for antineoplastic radiation therapy: Secondary | ICD-10-CM | POA: Diagnosis not present

## 2024-01-15 DIAGNOSIS — C61 Malignant neoplasm of prostate: Secondary | ICD-10-CM | POA: Diagnosis not present

## 2024-01-15 DIAGNOSIS — Z191 Hormone sensitive malignancy status: Secondary | ICD-10-CM | POA: Diagnosis not present

## 2024-01-15 LAB — RAD ONC ARIA SESSION SUMMARY
Course Elapsed Days: 57
Plan Fractions Treated to Date: 13
Plan Prescribed Dose Per Fraction: 2.5 Gy
Plan Total Fractions Prescribed: 13
Plan Total Prescribed Dose: 32.5 Gy
Reference Point Dosage Given to Date: 32.5 Gy
Reference Point Session Dosage Given: 2.5 Gy
Session Number: 38

## 2024-01-16 NOTE — Radiation Completion Notes (Addendum)
  Radiation Oncology         [phone] ________________________________  Name: Ian White MRN: [id number]  Date: 01/15/2024  DOB: 12-23-1956  Referring Physician: Bjorn Pippin, M.D. Date of Service: 2024-01-16 Radiation Oncologist: Margaretmary Bayley, M.D. [facility] [place]     RADIATION ONCOLOGY END OF TREATMENT NOTE     Diagnosis: 68 y.o. gentleman with a rising, detectable postoperative PSA of 4.32 s/p RALP 12/2014 for Stage pT2cNX, Gleason 3+4 prostate cancer   Intent: Curative     ==========DELIVERED PLANS==========  First Treatment Date: 2023-11-19 Last Treatment Date: 2024-01-15   Plan Name: ProstBed_Pelv Site: Prostate Bed Technique: IMRT Mode: Photon Dose Per Fraction: 1.8 Gy Prescribed Dose (Delivered / Prescribed): 45 Gy / 45 Gy Prescribed Fxs (Delivered / Prescribed): 25 / 25   Plan Name: ProstBed_Bst Site: Prostate Bed Technique: IMRT Mode: Photon Dose Per Fraction: 2.5 Gy Prescribed Dose (Delivered / Prescribed): 32.5 Gy / 32.5 Gy Prescribed Fxs (Delivered / Prescribed): 13 / 13     ==========ON TREATMENT VISIT DATES========== 2023-11-20, 2023-12-01, 2023-12-05, 2023-12-12, 2023-12-19, 2023-12-26, 2024-01-08, 2024-01-15   See weekly On Treatment Notes in Epic for details in the Media tab (listed as Progress notes on the On Treatment Visit Dates listed above).  He tolerated the radiation treatments relatively well with only mild urinary bother and modest fatigue.  The patient will receive a call in about one month from the radiation oncology department. He will continue follow up with his urologist, Dr. Annabell Howells, as well.  ------------------------------------------------   Margaretmary Dys, MD [facility]  Radiation Oncology Direct Dial: [phone]  Fax: [phone] [email or web address]  Skype  LinkedIn

## 2024-01-22 NOTE — Progress Notes (Signed)
Patient was a RadOnc Consult on 10/31/23 for his detectable postoperative PSA of 4.32.  Patient proceed with treatment recommendations of 7.5 weeks of salvage external beam therapy and had his final radiation treatment on 01/15/24.   Patient is scheduled for a post treatment nurse call on 02/17/24 and has his first post treatment PSA on 04/07/24 at [facility].

## 2024-02-17 ENCOUNTER — Ambulatory Visit
Admission: RE | Admit: 2024-02-17 | Discharge: 2024-02-17 | Disposition: A | Discharge status: Home / Self Care | Payer: BC Managed Care – PPO | Source: Ambulatory Visit | Attending: Radiation Oncology | Admitting: Radiation Oncology

## 2024-02-17 NOTE — Progress Notes (Signed)
  Radiation Oncology         [phone]) [phone] ________________________________  Name: Ian White MRN: [id number]  Date of Service: 02/17/2024  DOB: 05/12/1956  Post Treatment Telephone Note  Diagnosis:  C61 Malignant neoplasm of prostate (as documented in provider EOT note)  Pre Treatment IPSS Score: 9 (as documented in the provider consult note)  The patient was available for call today.   Symptoms of fatigue have improved since completing therapy.  Symptoms of bladder changes have improved since completing therapy. Current symptoms include none, and medications for bladder symptoms include none.  Symptoms of bowel changes have improved since completing therapy. Current symptoms include none, and medications for bowel symptoms include none.   Post Treatment IPSS Score: IPSS Questionnaire (AUA-7): Over the past month.   1)  How often have you had a sensation of not emptying your bladder completely after you finish urinating?  0 - Not at all  2)  How often have you had to urinate again less than two hours after you finished urinating? 3 - About half the time  3)  How often have you found you stopped and started again several times when you urinated?  0 - Not at all  4) How difficult have you found it to postpone urination?  0 - Not at all  5) How often have you had a weak urinary stream?  0 - Not at all  6) How often have you had to push or strain to begin urination?  0 - Not at all  7) How many times did you most typically get up to urinate from the time you went to bed until the time you got up in the morning?  2 - 2 times  Total score:  5. Which indicates mild symptoms  0-7 mildly symptomatic   8-19 moderately symptomatic   20-35 severely symptomatic    Patient has a scheduled follow up visit with his urologist, Dr. Annabell Howells, on 04/14/2024 for ongoing surveillance. He was counseled that PSA levels will be drawn in the urology office, and was reassured that additional time is  expected to improve bowel and bladder symptoms. He was encouraged to call back with concerns or questions regarding radiation.   This concludes the interaction.  Ruel Favors, LPN

## 2024-02-20 ENCOUNTER — Other Ambulatory Visit: Payer: Self-pay | Admitting: Urology

## 2024-02-20 DIAGNOSIS — C61 Malignant neoplasm of prostate: Secondary | ICD-10-CM

## 2024-02-27 ENCOUNTER — Encounter: Payer: Self-pay | Admitting: *Deleted

## 2024-03-05 ENCOUNTER — Encounter: Payer: Self-pay | Admitting: *Deleted

## 2024-03-25 DIAGNOSIS — H35371 Puckering of macula, right eye: Secondary | ICD-10-CM | POA: Diagnosis not present

## 2024-03-25 DIAGNOSIS — H401313 Pigmentary glaucoma, right eye, severe stage: Secondary | ICD-10-CM | POA: Diagnosis not present

## 2024-03-25 DIAGNOSIS — Z961 Presence of intraocular lens: Secondary | ICD-10-CM | POA: Diagnosis not present

## 2024-03-25 DIAGNOSIS — H4423 Degenerative myopia, bilateral: Secondary | ICD-10-CM | POA: Diagnosis not present

## 2024-03-29 ENCOUNTER — Encounter: Payer: Self-pay | Admitting: *Deleted

## 2024-03-29 NOTE — Progress Notes (Signed)
 Completed and mailed SCP summary for upcoming appt.

## 2024-03-30 ENCOUNTER — Ambulatory Visit (HOSPITAL_COMMUNITY)

## 2024-03-31 ENCOUNTER — Ambulatory Visit (HOSPITAL_COMMUNITY)
Admission: RE | Admit: 2024-03-31 | Discharge: 2024-03-31 | Disposition: A | Discharge status: Home / Self Care | Source: Ambulatory Visit | Attending: Cardiovascular Disease | Admitting: Cardiovascular Disease

## 2024-03-31 DIAGNOSIS — I7121 Aneurysm of the ascending aorta, without rupture: Secondary | ICD-10-CM | POA: Insufficient documentation

## 2024-03-31 DIAGNOSIS — I2510 Atherosclerotic heart disease of native coronary artery without angina pectoris: Secondary | ICD-10-CM | POA: Diagnosis not present

## 2024-03-31 MED ORDER — IOHEXOL 350 MG/ML SOLN
75.0000 mL | Freq: Once | INTRAVENOUS | Status: AC | PRN
  Administered 2024-03-31: 75 mL via INTRAVENOUS

## 2024-04-12 ENCOUNTER — Encounter: Payer: Self-pay | Admitting: Cardiovascular Disease

## 2024-04-13 ENCOUNTER — Encounter: Payer: Self-pay | Admitting: *Deleted

## 2024-04-13 ENCOUNTER — Inpatient Hospital Stay: Attending: Adult Health | Admitting: *Deleted

## 2024-04-13 DIAGNOSIS — C61 Malignant neoplasm of prostate: Secondary | ICD-10-CM

## 2024-04-13 NOTE — Progress Notes (Signed)
 SCP reviewed and completed. Allergy and medications UTD. Vaccines UTD. Nutrition and exercise were discussed. Pt currently runs 15 miles per week. Last colonoscopy was 12/22/2019. Pt will have post-tx PSA labs on 05/25/2024 at [facility] and see urologist a week later. Copy of summary has been sent to PCP.

## 2024-04-27 ENCOUNTER — Telehealth: Payer: Self-pay

## 2024-04-27 DIAGNOSIS — I7121 Aneurysm of the ascending aorta, without rupture: Secondary | ICD-10-CM

## 2024-04-27 NOTE — Telephone Encounter (Signed)
 Order placed for CTA Aorta at this time to be done in 1 year. Pt has viewed via MyChart.

## 2024-04-27 NOTE — Telephone Encounter (Signed)
-----   Message from Ahmad Alert sent at 04/12/2024  4:09 PM EDT ----- Mild , stable aortic dilatation of his ascending aorta  This is unchanged from the CT scan 05/05/23  Repeat CTA of the ascending aorta in 1 year

## 2024-05-25 DIAGNOSIS — C61 Malignant neoplasm of prostate: Secondary | ICD-10-CM | POA: Diagnosis not present

## 2024-06-02 DIAGNOSIS — E785 Hyperlipidemia, unspecified: Secondary | ICD-10-CM | POA: Diagnosis not present

## 2024-06-02 DIAGNOSIS — N393 Stress incontinence (female) (male): Secondary | ICD-10-CM | POA: Diagnosis not present

## 2024-06-02 DIAGNOSIS — Z8546 Personal history of malignant neoplasm of prostate: Secondary | ICD-10-CM | POA: Diagnosis not present

## 2024-06-02 DIAGNOSIS — E291 Testicular hypofunction: Secondary | ICD-10-CM | POA: Diagnosis not present

## 2024-06-02 DIAGNOSIS — Z79899 Other long term (current) drug therapy: Secondary | ICD-10-CM | POA: Diagnosis not present

## 2024-06-02 DIAGNOSIS — C61 Malignant neoplasm of prostate: Secondary | ICD-10-CM | POA: Diagnosis not present

## 2024-06-09 DIAGNOSIS — Z1339 Encounter for screening examination for other mental health and behavioral disorders: Secondary | ICD-10-CM | POA: Diagnosis not present

## 2024-06-09 DIAGNOSIS — I712 Thoracic aortic aneurysm, without rupture, unspecified: Secondary | ICD-10-CM | POA: Diagnosis not present

## 2024-06-09 DIAGNOSIS — Z1331 Encounter for screening for depression: Secondary | ICD-10-CM | POA: Diagnosis not present

## 2024-06-09 DIAGNOSIS — Z0000 Encounter for general adult medical examination without abnormal findings: Secondary | ICD-10-CM | POA: Diagnosis not present

## 2024-06-28 DIAGNOSIS — Z79899 Other long term (current) drug therapy: Secondary | ICD-10-CM | POA: Diagnosis not present

## 2024-07-26 ENCOUNTER — Encounter: Payer: Self-pay | Admitting: Genetic Counselor

## 2024-07-27 ENCOUNTER — Encounter: Payer: Self-pay | Admitting: Genetic Counselor

## 2024-07-27 ENCOUNTER — Other Ambulatory Visit: Payer: Self-pay | Admitting: Genetic Counselor

## 2024-07-27 ENCOUNTER — Inpatient Hospital Stay (HOSPITAL_BASED_OUTPATIENT_CLINIC_OR_DEPARTMENT_OTHER): Admitting: Genetic Counselor

## 2024-07-27 ENCOUNTER — Inpatient Hospital Stay: Attending: Adult Health

## 2024-07-27 DIAGNOSIS — Z803 Family history of malignant neoplasm of breast: Secondary | ICD-10-CM | POA: Insufficient documentation

## 2024-07-27 DIAGNOSIS — C61 Malignant neoplasm of prostate: Secondary | ICD-10-CM

## 2024-07-27 DIAGNOSIS — Z808 Family history of malignant neoplasm of other organs or systems: Secondary | ICD-10-CM | POA: Insufficient documentation

## 2024-07-27 DIAGNOSIS — Z8042 Family history of malignant neoplasm of prostate: Secondary | ICD-10-CM | POA: Diagnosis not present

## 2024-07-27 DIAGNOSIS — N402 Nodular prostate without lower urinary tract symptoms: Secondary | ICD-10-CM

## 2024-07-27 LAB — GENETIC SCREENING ORDER

## 2024-07-27 NOTE — Progress Notes (Signed)
 REFERRING PROVIDER: Valentin Skates, DO [address] [place],  [place] [address]  PRIMARY PROVIDER:  Valentin Skates, DO  PRIMARY REASON FOR VISIT:  1. Family history of prostate cancer   2. Family history of breast cancer   3. Family history of melanoma   4. Local recurrence of malignant neoplasm of prostate (HCC)      HISTORY OF PRESENT ILLNESS:   Ian White, a 68 y.o. male, was seen for a [facility] cancer genetics consultation at the request of Dr. Valentin due to a personal and family history of prostate cancer.  Ian White presents to clinic today to discuss the possibility of a hereditary predisposition to cancer, genetic testing, and to further clarify his future cancer risks, as well as potential cancer risks for family members.   In 2016, at the age of 89, Ian White was diagnosed with cancer of the prostate. The treatment plan included a prostatectomy.    CANCER HISTORY:  Oncology History  Local recurrence of malignant neoplasm of prostate (HCC)  11/16/2014 Cancer Staging   Staging form: Prostate, AJCC 8th Edition - Clinical stage from 11/16/2014: Stage IIC (cT1c, cN0, cM0, PSA: 12.2, Grade Group: 3) - Signed by Sherwood Rise, PA-C on 02/20/2024 Histopathologic type: Adenocarcinoma, NOS Stage prefix: Initial diagnosis Prostate specific antigen (PSA) range: 10 to 19 Gleason primary pattern: 4 Gleason secondary pattern: 3 Gleason score: 7 Histologic grading system: 5 grade system   01/19/2015 Cancer Staging   Staging form: Prostate, AJCC 8th Edition - Pathologic stage from 01/19/2015: Stage Unknown (pT2, pNX, cM0, PSA: 12.2, Grade Group: 2) - Signed by Sherwood Rise, PA-C on 02/20/2024 Histopathologic type: Adenocarcinoma, NOS Stage prefix: Initial diagnosis Prostate specific antigen (PSA) range: 10 to 19 Gleason primary pattern: 3 Gleason secondary pattern: 4 Gleason score: 7 Histologic grading system: 5 grade system Residual tumor (R): R0   10/30/2023  Initial Diagnosis   Local recurrence of malignant neoplasm of prostate (HCC)     Past Medical History:  Diagnosis Date   Elevated PSA    Family history of breast cancer    Family history of melanoma    Family history of prostate cancer    GERD (gastroesophageal reflux disease)    Glaucoma    bilateral   Prostate cancer (HCC)    Stress incontinence 06/11/2023    Past Surgical History:  Procedure Laterality Date   HERNIA REPAIR     PROSTATECTOMY  2015   due to cancer    Social History   Socioeconomic History   Marital status: Married    Spouse name: Not on file   Number of children: Not on file   Years of education: Not on file   Highest education level: Not on file  Occupational History   Not on file  Tobacco Use   Smoking status: Never   Smokeless tobacco: Never  Vaping Use   Vaping status: Never Used  Substance and Sexual Activity   Alcohol  use: Yes    Comment: monthly or less   Drug use: Never   Sexual activity: Not Currently  Other Topics Concern   Not on file  Social History Narrative   Not on file   Social Drivers of Health   Financial Resource Strain: Not on file  Food Insecurity: No Food Insecurity (10/31/2023)   Hunger Vital Sign    Worried About Running Out of Food in the Last Year: Never true    Ran Out of Food in the Last Year: Never true  Transportation  Needs: No Transportation Needs (10/31/2023)   PRAPARE - Administrator, Civil Service (Medical): No    Lack of Transportation (Non-Medical): No  Physical Activity: Sufficiently Active (11/03/2019)   Received from [facility] visits prior to 03/01/2023.   Exercise Vital Sign    Days of Exercise per Week: 6 days    Minutes of Exercise per Session: 60 min  Stress: Not on file  Social Connections: Not on file     FAMILY HISTORY:  We obtained a detailed, 4-generation family history.  Significant diagnoses are listed below: Family History  Problem Relation Age  of Onset   Lymphoma Mother    Heart disease Mother        stent, bypass   Heart disease Father    Prostate cancer Father    Parkinson's disease Father    Glaucoma Father    Thyroid disease Sister    Hypertension Brother        ?   Diabetes Brother    Kidney disease Brother    Schizophrenia Brother    Breast cancer Maternal Aunt        Dx twice   Melanoma Maternal Aunt    Prostate cancer Paternal Uncle    Cancer Paternal Grandmother    Prostate cancer Paternal Grandfather    Prostate cancer Maternal Cousin    Prostate cancer Maternal Cousin    Hodgkin's lymphoma Maternal Cousin    Cancer Paternal Cousin        NOS. d. 46s     The patient has three sons and a daughter who are cancer free.  He has two brothers and a sister who are cancer free.  Both parents are deceased.  The patient's father had prostate cancer.  He has one brother who had prostate cancer.  This brother has a daughter who died of an unknown cancer.  The paternal grandparents are deceased.  The grandfather had prostate cancer and the grandmother had an unknown cancer.  The patient's mother had lymphoma.  She has a sister who had breast cancer twice and a sister with melanoma.  The patient has two maternal cousins with prostate cancer.  There is no other reported cancer.  Ian White is unaware of previous family history of genetic testing for hereditary cancer risks. There is no reported Ashkenazi Jewish ancestry. There is no known consanguinity.  GENETIC COUNSELING ASSESSMENT: Ian White is a 68 y.o. male with a personal and family history of prostate cancer which is somewhat suggestive of a hereditary cancer syndrome and predisposition to cancer given the number of cases of prostate cancer in the family and combination of cancer. We, therefore, discussed and recommended the following at today's visit.   DISCUSSION: We discussed that, in general, most cancer is not inherited in families, but instead is  sporadic or familial. Sporadic cancers occur by chance and typically happen at older ages (>50 years) as this type of cancer is caused by genetic changes acquired during an individual's lifetime. Some families have more cancers than would be expected by chance; however, the ages or types of cancer are not consistent with a known genetic mutation or known genetic mutations have been ruled out. This type of familial cancer is thought to be due to a combination of multiple genetic, environmental, hormonal, and lifestyle factors. While this combination of factors likely increases the risk of cancer, the exact source of this risk is not currently identifiable or testable.  We discussed that up  to 15% of prostate cancer is hereditary, with most cases associated with BRCA mutations.  There are other genes that can be associated with hereditary prostate cancer syndromes.  These include ATM, PALB2 and HOXB13.  We discussed that testing is beneficial for several reasons including knowing how to follow individuals after completing their treatment, identifying whether potential treatment options such as PARP inhibitors would be beneficial, and understand if other family members could be at risk for cancer and allow them to undergo genetic testing.   We reviewed the characteristics, features and inheritance patterns of hereditary cancer syndromes. We also discussed genetic testing, including the appropriate family members to test, the process of testing, insurance coverage and turn-around-time for results. We discussed the implications of a negative, positive, carrier and/or variant of uncertain significant result. Ian White  was offered a common hereditary cancer panel (36+ genes) and an expanded pan-cancer panel (70+ genes). Ian White was informed of the benefits and limitations of each panel, including that expanded pan-cancer panels contain genes that do not have clear management guidelines at this point in time.   We also discussed that as the number of genes included on a panel increases, the chances of variants of uncertain significance increases. Ian White decided to pursue genetic testing for the CancerNext-Expanded+RNAinsight gene panel.   The CancerNext-Expanded gene panel offered by [facility] and includes sequencing, rearrangement, and RNA analysis for the following 77 genes: AIP, ALK, APC, ATM, BAP1, BARD1, BMPR1A, BRCA1, BRCA2, BRIP1, CDC73, CDH1, CDK4, CDKN1B, CDKN2A, CEBPA, CHEK2, CTNNA1, DDX41, DICER1, ETV6, FH, FLCN, GATA2, LZTR1, MAX, MBD4, MEN1, MET, MLH1, MSH2, MSH3, MSH6, MUTYH, NF1, NF2, NTHL1, PALB2, PHOX2B, PMS2, POT1, PRKAR1A, PTCH1, PTEN, RAD51C, RAD51D, RB1, RET, RPS20, RUNX1, SDHA, SDHAF2, SDHB, SDHC, SDHD, SMAD4, SMARCA4, SMARCB1, SMARCE1, STK11, SUFU, TMEM127, TP53, TSC1, TSC2, VHL, and WT1 (sequencing and deletion/duplication); AXIN2, CTNNA1, DDX41, EGFR, HOXB13, KIT, MBD4, MITF, MSH3, PDGFRA, POLD1 and POLE (sequencing only); EPCAM and GREM1 (deletion/duplication only). RNA data is routinely analyzed for use in variant interpretation for all genes.   Based on Ian White personal and family history of cancer, he meets medical criteria for genetic testing. Despite that he meets criteria, he may still have an out of pocket cost. We discussed that if his out of pocket cost for testing is over $100, the laboratory will call and confirm whether he wants to proceed with testing.  If the out of pocket cost of testing is less than $100 he will be billed by the genetic testing laboratory.   We discussed that some people do not want to undergo genetic testing due to fear of genetic discrimination.  The Genetic Information Nondiscrimination Act (GINA) was signed into federal law in 2008. GINA prohibits health insurers and most employers from discriminating against individuals based on genetic information (including the results of genetic tests and family history information). According to  GINA, health insurance companies cannot consider genetic information to be a preexisting condition, nor can they use it to make decisions regarding coverage or rates. GINA also makes it illegal for most employers to use genetic information in making decisions about hiring, firing, promotion, or terms of employment. It is important to note that GINA does not offer protections for life insurance, disability insurance, or long-term care insurance. GINA does not apply to those in the [employer], those who work for companies with less than 15 employees, and new life insurance or long-term disability insurance policies.  Health status due to a cancer diagnosis is not protected  under GINA. More information about GINA can be found by visiting [email or web address].  PLAN: After considering the risks, benefits, and limitations, Ian White provided informed consent to pursue genetic testing and the blood sample was sent to [facility] for analysis of the CancerNext-Expanded+RNAinsight. Results should be available within approximately 2-3 weeks' time, at which point they will be disclosed by telephone to Ian White, as will any additional recommendations warranted by these results. Ian White will receive a summary of his genetic counseling visit and a copy of his results once available. This information will also be available in Epic.   Lastly, we encouraged Ian White to remain in contact with cancer genetics annually so that we can continuously update the family history and inform him of any changes in cancer genetics and testing that may be of benefit for this family.   Ian White questions were answered to his satisfaction today. Our contact information was provided should additional questions or concerns arise. Thank you for the referral and allowing us  to share in the care of your patient.   Omarion Minnehan P. Perri, MS, CGC Licensed, Research officer, political party [email or web address] phone: [phone]  In total, 60 minutes were spent on the date of the encounter in service to the patient including preparation, face-to-face consultation, documentation and care coordination.  The patient was seen alone.  Drs. Lanny Stalls, and/or Gudena were available for questions, if needed..    _______________________________________________________________________ For Office Staff:  Number of people involved in session: 1 Was an Intern/ student involved with case: no

## 2024-08-02 ENCOUNTER — Telehealth: Payer: Self-pay | Admitting: Genetic Counselor

## 2024-08-02 ENCOUNTER — Encounter: Payer: Self-pay | Admitting: Genetic Counselor

## 2024-08-02 DIAGNOSIS — Z1379 Encounter for other screening for genetic and chromosomal anomalies: Secondary | ICD-10-CM | POA: Insufficient documentation

## 2024-08-02 NOTE — Telephone Encounter (Signed)
 LM on VM that results are back and to please call.  Left CB instructions with wife.

## 2024-08-02 NOTE — Telephone Encounter (Signed)
 Revealed negative genetic testing.  Discussed that we do not know why he has Prostate cancer or why there is cancer in the family. It could be due to a different gene that we are not testing, or maybe our current technology may not be able to pick something up.  It will be important for him to keep in contact with genetics to keep up with whether additional testing may be needed.

## 2024-08-04 ENCOUNTER — Ambulatory Visit: Payer: Self-pay | Admitting: Genetic Counselor

## 2024-08-04 DIAGNOSIS — Z1379 Encounter for other screening for genetic and chromosomal anomalies: Secondary | ICD-10-CM

## 2024-08-04 NOTE — Progress Notes (Signed)
 HPI:  Mr. Verdejo was previously seen in the [facility] due to a personal and family history of prostate cancer and concerns regarding a hereditary predisposition to cancer. Please refer to our prior cancer genetics clinic note for more information regarding our discussion, assessment and recommendations, at the time. Mr. Mika recent genetic test results were disclosed to him, as were recommendations warranted by these results. These results and recommendations are discussed in more detail below.  CANCER HISTORY:  Oncology History  Local recurrence of malignant neoplasm of prostate (HCC)  11/16/2014 Cancer Staging   Staging form: Prostate, AJCC 8th Edition - Clinical stage from 11/16/2014: Stage IIC (cT1c, cN0, cM0, PSA: 12.2, Grade Group: 3) - Signed by Sherwood Rise, PA-C on 02/20/2024 Histopathologic type: Adenocarcinoma, NOS Stage prefix: Initial diagnosis Prostate specific antigen (PSA) range: 10 to 19 Gleason primary pattern: 4 Gleason secondary pattern: 3 Gleason score: 7 Histologic grading system: 5 grade system   01/19/2015 Cancer Staging   Staging form: Prostate, AJCC 8th Edition - Pathologic stage from 01/19/2015: Stage Unknown (pT2, pNX, cM0, PSA: 12.2, Grade Group: 2) - Signed by Sherwood Rise, PA-C on 02/20/2024 Histopathologic type: Adenocarcinoma, NOS Stage prefix: Initial diagnosis Prostate specific antigen (PSA) range: 10 to 19 Gleason primary pattern: 3 Gleason secondary pattern: 4 Gleason score: 7 Histologic grading system: 5 grade system Residual tumor (R): R0   10/30/2023 Initial Diagnosis   Local recurrence of malignant neoplasm of prostate (HCC)   08/02/2024 Genetic Testing   Negative genetic testing on the CancerNext-Expanded+RNAinsight panel.  The report date is August 01, 2024.  The CancerNext-Expanded gene panel offered by [facility] and includes sequencing, rearrangement, and RNA analysis for the following 77 genes: AIP,  ALK, APC, ATM, BAP1, BARD1, BMPR1A, BRCA1, BRCA2, BRIP1, CDC73, CDH1, CDK4, CDKN1B, CDKN2A, CEBPA, CHEK2, CTNNA1, DDX41, DICER1, ETV6, FH, FLCN, GATA2, LZTR1, MAX, MBD4, MEN1, MET, MLH1, MSH2, MSH3, MSH6, MUTYH, NF1, NF2, NTHL1, PALB2, PHOX2B, PMS2, POT1, PRKAR1A, PTCH1, PTEN, RAD51C, RAD51D, RB1, RET, RPS20, RUNX1, SDHA, SDHAF2, SDHB, SDHC, SDHD, SMAD4, SMARCA4, SMARCB1, SMARCE1, STK11, SUFU, TMEM127, TP53, TSC1, TSC2, VHL, and WT1 (sequencing and deletion/duplication); AXIN2, CTNNA1, DDX41, EGFR, HOXB13, KIT, MBD4, MITF, MSH3, PDGFRA, POLD1 and POLE (sequencing only); EPCAM and GREM1 (deletion/duplication only). RNA data is routinely analyzed for use in variant interpretation for all genes.      FAMILY HISTORY:  We obtained a detailed, 4-generation family history.  Significant diagnoses are listed below: Family History  Problem Relation Age of Onset   Lymphoma Mother    Heart disease Mother        stent, bypass   Heart disease Father    Prostate cancer Father    Parkinson's disease Father    Glaucoma Father    Thyroid disease Sister    Hypertension Brother        ?   Diabetes Brother    Kidney disease Brother    Schizophrenia Brother    Breast cancer Maternal Aunt        Dx twice   Melanoma Maternal Aunt    Prostate cancer Paternal Uncle    Cancer Paternal Grandmother    Prostate cancer Paternal Grandfather    Prostate cancer Maternal Cousin    Prostate cancer Maternal Cousin    Hodgkin's lymphoma Maternal Cousin    Cancer Paternal Cousin        NOS. d. 2s       The patient has three sons and a daughter who are cancer free.  He has two brothers and a sister who are cancer free.  Both parents are deceased.   The patient's father had prostate cancer.  He has one brother who had prostate cancer.  This brother has a daughter who died of an unknown cancer.  The paternal grandparents are deceased.  The grandfather had prostate cancer and the grandmother had an unknown cancer.    The patient's mother had lymphoma.  She has a sister who had breast cancer twice and a sister with melanoma.  The patient has two maternal cousins with prostate cancer.  There is no other reported cancer.   Mr. Stetson is unaware of previous family history of genetic testing for hereditary cancer risks. There is no reported Ashkenazi Jewish ancestry. There is no known consanguinity  GENETIC TEST RESULTS: Genetic testing reported out on August 01, 2024 through the CancerNext-Expanded+RNAinsight cancer panel found no pathogenic mutations. The CancerNext-Expanded gene panel offered by [facility] and includes sequencing, rearrangement, and RNA analysis for the following 77 genes: AIP, ALK, APC, ATM, BAP1, BARD1, BMPR1A, BRCA1, BRCA2, BRIP1, CDC73, CDH1, CDK4, CDKN1B, CDKN2A, CEBPA, CHEK2, CTNNA1, DDX41, DICER1, ETV6, FH, FLCN, GATA2, LZTR1, MAX, MBD4, MEN1, MET, MLH1, MSH2, MSH3, MSH6, MUTYH, NF1, NF2, NTHL1, PALB2, PHOX2B, PMS2, POT1, PRKAR1A, PTCH1, PTEN, RAD51C, RAD51D, RB1, RET, RPS20, RUNX1, SDHA, SDHAF2, SDHB, SDHC, SDHD, SMAD4, SMARCA4, SMARCB1, SMARCE1, STK11, SUFU, TMEM127, TP53, TSC1, TSC2, VHL, and WT1 (sequencing and deletion/duplication); AXIN2, CTNNA1, DDX41, EGFR, HOXB13, KIT, MBD4, MITF, MSH3, PDGFRA, POLD1 and POLE (sequencing only); EPCAM and GREM1 (deletion/duplication only). RNA data is routinely analyzed for use in variant interpretation for all genes. The test report has been scanned into EPIC and is located under the Molecular Pathology section of the Results Review tab.  A portion of the result report is included below for reference.     We discussed with Mr. Hintz that because current genetic testing is not perfect, it is possible there may be a gene mutation in one of these genes that current testing cannot detect, but that chance is small.  We also discussed, that there could be another gene that has not yet been discovered, or that we have not yet tested, that is  responsible for the cancer diagnoses in the family. It is also possible there is a hereditary cause for the cancer in the family that Mr. Micheals did not inherit and therefore was not identified in his testing.  Therefore, it is important to remain in touch with cancer genetics in the future so that we can continue to offer Mr. Messler the most up to date genetic testing.   ADDITIONAL GENETIC TESTING: We discussed with Mr. Partington that his genetic testing was fairly extensive.  If there are genes identified to increase cancer risk that can be analyzed in the future, we would be happy to discuss and coordinate this testing at that time.    CANCER SCREENING RECOMMENDATIONS: Mr. Gaskill test result is considered negative (normal).  This means that we have not identified a hereditary cause for his personal and family history of prostate cancer at this time. Most cancers happen by chance and this negative test suggests that his personal and family history of prostate cancer may fall into this category.    Possible reasons for Mr. Balan negative genetic test include:  1. There may be a gene mutation in one of these genes that current testing methods cannot detect but that chance is small.  2. There could be another gene that has not  yet been discovered, or that we have not yet tested, that is responsible for the cancer diagnoses in the family.  3.  There may be no hereditary risk for cancer in the family. The cancers in Mr. Dieguez and/or his family may be sporadic/familial or due to other genetic and environmental factors. 4. It is also possible there is a hereditary cause for the cancer in the family that Mr. Sahli did not inherit.  Therefore, it is recommended he continue to follow the cancer management and screening guidelines provided by his oncology and primary healthcare provider. An individual's cancer risk and medical management are not determined by genetic test results alone.  Overall cancer risk assessment incorporates additional factors, including personal medical history, family history, and any available genetic information that may result in a personalized plan for cancer prevention and surveillance  RECOMMENDATIONS FOR FAMILY MEMBERS:   Since he did not inherit a identifiable mutation in a cancer predisposition gene included on this panel, his children could not have inherited a known mutation from his in one of these genes. Individuals in this family might be at some increased risk of developing cancer, over the general population risk, simply due to the family history of cancer.  We recommended women in this family have a yearly mammogram beginning at age 70, or 29 years younger than the earliest onset of cancer, an annual clinical breast exam, and perform monthly breast self-exams. Women in this family should also have a gynecological exam as recommended by their primary provider. All family members should be referred for colonoscopy starting at age 49, or 50 years younger than the earliest onset of cancer.  FOLLOW-UP: Lastly, we discussed with Mr. Hidrogo that cancer genetics is a rapidly advancing field and it is possible that new genetic tests will be appropriate for him and/or his family members in the future. We encouraged him to remain in contact with cancer genetics on an annual basis so we can update his personal and family histories and let him know of advances in cancer genetics that may benefit this family.   Our contact number was provided. Mr. Buenaventura questions were answered to his satisfaction, and he knows he is welcome to call us  at anytime with additional questions or concerns.   Darice Monte, MS, [facility] Licensed, Certified Genetic Counselor [email or web address]

## 2024-09-01 DIAGNOSIS — C61 Malignant neoplasm of prostate: Secondary | ICD-10-CM | POA: Diagnosis not present

## 2024-09-28 DIAGNOSIS — H401313 Pigmentary glaucoma, right eye, severe stage: Secondary | ICD-10-CM | POA: Diagnosis not present

## 2024-10-18 ENCOUNTER — Encounter: Payer: Self-pay | Admitting: Cardiology

## 2024-10-18 ENCOUNTER — Ambulatory Visit: Attending: Cardiology | Admitting: Cardiology

## 2024-10-18 VITALS — BP 124/75 | HR 47 | Resp 16 | Ht 70.0 in | Wt 162.7 lb

## 2024-10-18 DIAGNOSIS — I7121 Aneurysm of the ascending aorta, without rupture: Secondary | ICD-10-CM | POA: Diagnosis not present

## 2024-10-18 DIAGNOSIS — R001 Bradycardia, unspecified: Secondary | ICD-10-CM

## 2024-10-18 DIAGNOSIS — R931 Abnormal findings on diagnostic imaging of heart and coronary circulation: Secondary | ICD-10-CM

## 2024-10-18 DIAGNOSIS — E7800 Pure hypercholesterolemia, unspecified: Secondary | ICD-10-CM | POA: Diagnosis not present

## 2024-10-18 NOTE — Progress Notes (Signed)
 Cardiology Office Note:  .   Date:  10/18/2024  ID:  Ian White, DOB 1956/12/16, MRN [id number] PCP: Valentin Skates, DO  [facility] Providers Cardiologist:  Aleene Passe, MD (Inactive)   History of Present Illness: .   Ian White is a 67 y.o. Caucasian male patient with elevated coronary calcium score in the 88th percentile on 05/29/2022, 4.3 cm ascending aortic aneurysm, last CT angiogram was on 03/31/2024 and stable size since 2023, asymptomatic marked sinus bradycardia presents for annual visit. He is asymptomatic.  Cardiac Studies relevent.        Discussed the use of AI scribe software for clinical note transcription with the patient, who gave verbal consent to proceed.  History of Present Illness Ian White is a 68 year old male who presents for cardiovascular evaluation. He was referred by Dr. Gerlene Finely for evaluation of aortic aneurysm and elevated coronary calcium score.  He has an aortic aneurysm measuring 4.3 cm and a high coronary calcium score. His cholesterol is managed with atorvastatin 40 mg once daily, effectively reducing levels without side effects.  He runs about 20 miles a week at a pace of 10 minutes per mile, reduced from 40 miles a week previously. He maintains a low heart rate, typically around 43 beats per minute, attributed to his running history. He follows an 'eighty twenty rule' to avoid overexertion.  No family history of heart disease. He does not use tobacco products. He engages in weight training twice a month and has reduced cheese intake to manage cholesterol levels.  No symptoms of chest pain, shortness of breath, or decreased exercise capacity.  Labs   Lab Results  Component Value Date   CHOL 111 09/05/2022   HDL 47 09/05/2022   LDLCALC 50 09/05/2022   TRIG 68 09/05/2022   CHOLHDL 2.4 09/05/2022   Lipoprotein (a)  Date/Time Value Ref Range Status  12/09/2022 09:43 AM 35.1 <75.0 nmol/L Final     Comment:    Note:  Values greater than or equal to 75.0 nmol/L may        indicate an independent risk factor for CHD,        but must be evaluated with caution when applied        to non-Caucasian populations due to the        influence of genetic factors on Lp(a) across        ethnicities.     Care everywhere/Faxed External Labs:  Labs 06/02/2024:  Total cholesterol 120, triglycerides 69, HDL 49, LDL 57.  TSH 1.15  Glu 96, eGFR 74, K 4.6, Cr 1.00 LFT normal  ROS  Review of Systems  Cardiovascular:  Negative for chest pain, dyspnea on exertion and leg swelling.   Physical Exam:   VS:  BP 124/75 (BP Location: Left Arm, Patient Position: Sitting, Cuff Size: Large)   Pulse (!) 47   Resp 16   Ht 5' 10 (1.778 m)   Wt 162 lb 11.2 oz (73.8 kg)   SpO2 100%   BMI 23.35 kg/m    Wt Readings from Last 3 Encounters:  10/18/24 162 lb 11.2 oz (73.8 kg)  10/31/23 164 lb 3.2 oz (74.5 kg)  03/25/23 160 lb (72.6 kg)    BP Readings from Last 3 Encounters:  10/18/24 124/75  10/31/23 118/82  03/25/23 134/86   Physical Exam Neck:     Vascular: No carotid bruit or JVD.  Cardiovascular:     Rate and Rhythm: Regular rhythm. Bradycardia  present.     Pulses: Intact distal pulses.     Heart sounds: Normal heart sounds. No murmur heard.    No gallop.  Pulmonary:     Effort: Pulmonary effort is normal.     Breath sounds: Normal breath sounds.  Abdominal:     General: Bowel sounds are normal.     Palpations: Abdomen is soft.  Musculoskeletal:     Right lower leg: No edema.     Left lower leg: No edema.    EKG:    EKG Interpretation Date/Time:  Monday October 18 2024 09:49:39 EDT Ventricular Rate:  48 PR Interval:  180 QRS Duration:  96 QT Interval:  428 QTC Calculation: 382 R Axis:   52  Text Interpretation: EKG 10/18/2024: Sinus bradycardia at rate of 48 bpm otherwise normal EKG.  Compared to 06/04/2022, no change, previous heart rate was 46 bpm. Confirmed by Nora Sabey, Jagadeesh  [id number]) on 10/18/2024 10:12:16 AM    ASSESSMENT AND PLAN: .      ICD-10-CM   1. Elevated coronary artery calcium score 05/29/22: CAC score is 650 - 88th Percentile  R93.1 EKG 12-Lead    2. Aneurysm of ascending aorta without rupture  I71.21     3. Sinus bradycardia  R00.1     4. Hypercholesteremia  E78.00      Assessment & Plan Ascending aortic aneurysm The ascending aortic aneurysm measures 4.3 cm, located in the ascending aorta, and has remained unchanged since 2023. There is debate in cardiology regarding the classification of aneurysms in athletes, with some suggesting that 4.3 cm may not be aneurysmal. The risk of rupture is high if it occurs, but the aneurysm has shown no growth over the past two years. - Repeat CT scan in three years to monitor aneurysm size, will request Dr. Massie Sewer to take over surveillance as he is stable from cardiac standpoint. - Avoid heavy lifting that requires holding breath  Elevated coronary calcium score in the 88 percentile with well-controlled risk factors, subclinical Coronary calcium score is at the 88th percentile, indicating subclinical coronary artery disease. He is on statin therapy, which has effectively lowered LDL cholesterol to 57 mg/dL. The statin therapy is aimed at reducing the risk of cardiovascular events by stabilizing plaque and reducing cholesterol levels. No current symptoms of coronary artery disease such as chest pain or shortness of breath. - Continue Lipitor 40 mg once daily - No need for repeat coronary calcium score due to statin therapy  Hyperlipidemia Hyperlipidemia is well-controlled with Lipitor 40 mg daily. LDL cholesterol is at 57 mg/dL, which is below the target of 70 mg/dL for non-diabetic patients. No side effects reported from the statin therapy. - Continue Lipitor 40 mg once daily  Physiologic bradycardia (athlete's heart) Bradycardia is physiologic due to his status as a runner, with a resting heart rate of  43-48 bpm. EKG is normal, and there are no symptoms of concern such as dizziness or syncope. Bradycardia is consistent with his athletic conditioning and is not considered pathological. - Monitor heart rate and symptoms - No need for annual screening unless symptoms develop  Follow up: PRN  Signed,  Gordy Bergamo, MD, [facility] 10/18/2024, 7:38 PM [facility] [address] [place], [place] [address] Phone: [phone]. Fax:  [phone]

## 2024-10-18 NOTE — Patient Instructions (Addendum)
 Medication Instructions:  Your physician recommends that you continue on your current medications as directed. Please refer to the Current Medication list given to you today.  *If you need a refill on your cardiac medications before your next appointment, please call your pharmacy*  Lab Work: None ordered.  You may go to any [facility] Location for your lab work:  [facility] - [address] ([facility]) - [address] [phone] [address]  [place] - [address]   [place]  - [address]   [place] - [address] - [address] [employer]  - [address] [place] - [address] ([facility]   If you have labs (blood work) drawn today and your tests are completely normal, you will receive your results only by: [employer] (if you have MyChart)  If you have any lab test that is abnormal or we need to change your treatment, we will call you or send a MyChart message to review the results.  Testing/Procedures: None ordered.  Follow-Up: At [facility], you and your health needs are our priority.  As part of our continuing mission to provide you with exceptional heart care, we have created designated Provider Care Teams.  These Care Teams include your primary Cardiologist (physician) and Advanced Practice Providers (APPs -  Physician Assistants and Nurse Practitioners) who all work together to provide you with the care you need, when you need it.    Your next appointment:   As needed  The format for your next appointment:   In Person  Provider:   Gordy Bergamo, MD

## 2024-10-25 DIAGNOSIS — Z860101 Personal history of adenomatous and serrated colon polyps: Secondary | ICD-10-CM | POA: Diagnosis not present

## 2024-12-01 DIAGNOSIS — K5730 Diverticulosis of large intestine without perforation or abscess without bleeding: Secondary | ICD-10-CM | POA: Diagnosis not present

## 2024-12-01 DIAGNOSIS — D125 Benign neoplasm of sigmoid colon: Secondary | ICD-10-CM | POA: Diagnosis not present

## 2024-12-01 DIAGNOSIS — Z860100 Personal history of colon polyps, unspecified: Secondary | ICD-10-CM | POA: Diagnosis not present

## 2024-12-01 DIAGNOSIS — Z09 Encounter for follow-up examination after completed treatment for conditions other than malignant neoplasm: Secondary | ICD-10-CM | POA: Diagnosis not present

## 2024-12-02 DIAGNOSIS — C61 Malignant neoplasm of prostate: Secondary | ICD-10-CM | POA: Diagnosis not present

## 2024-12-08 DIAGNOSIS — R9721 Rising PSA following treatment for malignant neoplasm of prostate: Secondary | ICD-10-CM | POA: Diagnosis not present

## 2024-12-08 DIAGNOSIS — C61 Malignant neoplasm of prostate: Secondary | ICD-10-CM | POA: Diagnosis not present

## 2024-12-08 DIAGNOSIS — N393 Stress incontinence (female) (male): Secondary | ICD-10-CM | POA: Diagnosis not present
